# Patient Record
Sex: Male | Born: 1950 | Race: White | Hispanic: No | Smoking: Former smoker
Health system: Southern US, Community
[De-identification: ages and names within clinical notes are randomized; demographics above are authoritative.]

## PROBLEM LIST (undated history)

## (undated) DIAGNOSIS — I10 Essential (primary) hypertension: Secondary | ICD-10-CM

## (undated) DIAGNOSIS — I219 Acute myocardial infarction, unspecified: Secondary | ICD-10-CM

## (undated) DIAGNOSIS — Z872 Personal history of diseases of the skin and subcutaneous tissue: Secondary | ICD-10-CM

## (undated) DIAGNOSIS — E785 Hyperlipidemia, unspecified: Secondary | ICD-10-CM

## (undated) DIAGNOSIS — M1990 Unspecified osteoarthritis, unspecified site: Secondary | ICD-10-CM

## (undated) DIAGNOSIS — I2510 Atherosclerotic heart disease of native coronary artery without angina pectoris: Secondary | ICD-10-CM

## (undated) HISTORY — DX: Personal history of diseases of the skin and subcutaneous tissue: Z87.2

## (undated) HISTORY — DX: Essential (primary) hypertension: I10

## (undated) HISTORY — DX: Unspecified osteoarthritis, unspecified site: M19.90

## (undated) HISTORY — DX: Acute myocardial infarction, unspecified: I21.9

## (undated) HISTORY — DX: Hyperlipidemia, unspecified: E78.5

---

## 1979-04-10 HISTORY — PX: SHOULDER SURGERY: SHX246

## 1998-10-11 ENCOUNTER — Emergency Department (HOSPITAL_COMMUNITY): Admission: EM | Admit: 1998-10-11 | Discharge: 1998-10-11 | Payer: Self-pay | Admitting: *Deleted

## 2008-01-11 ENCOUNTER — Emergency Department (HOSPITAL_COMMUNITY): Admission: EM | Admit: 2008-01-11 | Discharge: 2008-01-11 | Payer: Self-pay | Admitting: Emergency Medicine

## 2008-05-17 ENCOUNTER — Inpatient Hospital Stay (HOSPITAL_COMMUNITY): Admission: EM | Admit: 2008-05-17 | Discharge: 2008-05-23 | Payer: Self-pay | Admitting: Emergency Medicine

## 2008-05-20 ENCOUNTER — Encounter (INDEPENDENT_AMBULATORY_CARE_PROVIDER_SITE_OTHER): Payer: Self-pay | Admitting: Cardiology

## 2008-06-03 ENCOUNTER — Encounter (HOSPITAL_COMMUNITY): Admission: RE | Admit: 2008-06-03 | Discharge: 2008-06-07 | Payer: Self-pay | Admitting: Cardiology

## 2010-07-25 LAB — LIPID PANEL
HDL: 33 mg/dL — ABNORMAL LOW (ref >39)
LDL Cholesterol: 147 mg/dL — ABNORMAL HIGH (ref 0–99)
Total CHOL/HDL Ratio: 7.6 RATIO
Total CHOL/HDL Ratio: 7.7 RATIO
Triglycerides: 288 mg/dL — ABNORMAL HIGH (ref <150)
VLDL: 58 mg/dL — ABNORMAL HIGH (ref 0–40)

## 2010-07-25 LAB — BASIC METABOLIC PANEL
BUN: 13 mg/dL (ref 6–23)
BUN: 17 mg/dL (ref 6–23)
CO2: 25 mEq/L (ref 19–32)
Calcium: 8.1 mg/dL — ABNORMAL LOW (ref 8.4–10.5)
Calcium: 8.4 mg/dL (ref 8.4–10.5)
Chloride: 101 mEq/L (ref 96–112)
Chloride: 96 mEq/L (ref 96–112)
Creatinine, Ser: 0.79 mg/dL (ref 0.4–1.5)
Creatinine, Ser: 0.94 mg/dL (ref 0.4–1.5)
Creatinine, Ser: 0.99 mg/dL (ref 0.4–1.5)
GFR calc Af Amer: >60 mL/min (ref >60)
GFR calc Af Amer: >60 mL/min (ref >60)
GFR calc non Af Amer: >60 mL/min (ref >60)
GFR calc non Af Amer: >60 mL/min (ref >60)
GFR calc non Af Amer: >60 mL/min (ref >60)
Glucose, Bld: 106 mg/dL — ABNORMAL HIGH (ref 70–99)
Glucose, Bld: 111 mg/dL — ABNORMAL HIGH (ref 70–99)
Potassium: 3.8 mEq/L (ref 3.5–5.1)
Sodium: 130 mEq/L — ABNORMAL LOW (ref 135–145)

## 2010-07-25 LAB — CARDIAC PANEL(CRET KIN+CKTOT+MB+TROPI)
CK, MB: 14.8 ng/mL — ABNORMAL HIGH (ref 0.3–4.0)
CK, MB: 27.2 ng/mL — ABNORMAL HIGH (ref 0.3–4.0)
CK, MB: 3.8 ng/mL (ref 0.3–4.0)
CK, MB: 458.1 ng/mL — ABNORMAL HIGH (ref 0.3–4.0)
CK, MB: 566.5 ng/mL — ABNORMAL HIGH (ref 0.3–4.0)
Relative Index: 10.1 — ABNORMAL HIGH (ref 0.0–2.5)
Relative Index: 11.6 — ABNORMAL HIGH (ref 0.0–2.5)
Relative Index: 4.2 — ABNORMAL HIGH (ref 0.0–2.5)
Total CK: 108 U/L (ref 7–232)
Total CK: 486 U/L — ABNORMAL HIGH (ref 7–232)
Troponin I: 17.12 ng/mL (ref 0.00–0.06)
Troponin I: 22.93 ng/mL (ref 0.00–0.06)
Troponin I: 23.63 ng/mL (ref 0.00–0.06)
Troponin I: 28.07 ng/mL (ref 0.00–0.06)
Troponin I: 52.08 ng/mL (ref 0.00–0.06)
Troponin I: 6.35 ng/mL (ref 0.00–0.06)
Troponin I: >100 ng/mL (ref 0.00–0.06)

## 2010-07-25 LAB — CBC
HCT: 38.6 % — ABNORMAL LOW (ref 39.0–52.0)
HCT: 51.6 % (ref 39.0–52.0)
Hemoglobin: 14.8 g/dL (ref 13.0–17.0)
MCHC: 34 g/dL (ref 30.0–36.0)
MCHC: 34.7 g/dL (ref 30.0–36.0)
MCV: 89.4 fL (ref 78.0–100.0)
MCV: 89.5 fL (ref 78.0–100.0)
MCV: 90.5 fL (ref 78.0–100.0)
Platelets: 181 10*3/uL (ref 150–400)
Platelets: 222 10*3/uL (ref 150–400)
Platelets: 241 10*3/uL (ref 150–400)
RBC: 4.27 MIL/uL (ref 4.22–5.81)
RBC: 5.77 MIL/uL (ref 4.22–5.81)
RDW: 14.9 % (ref 11.5–15.5)
RDW: 14.9 % (ref 11.5–15.5)
WBC: 12.2 10*3/uL — ABNORMAL HIGH (ref 4.0–10.5)
WBC: 16.9 10*3/uL — ABNORMAL HIGH (ref 4.0–10.5)
WBC: 21.3 10*3/uL — ABNORMAL HIGH (ref 4.0–10.5)

## 2010-07-25 LAB — POCT CARDIAC MARKERS

## 2010-07-25 LAB — POCT I-STAT, CHEM 8
Chloride: 109 mEq/L (ref 96–112)
Creatinine, Ser: 0.9 mg/dL (ref 0.4–1.5)

## 2010-07-25 LAB — PROTIME-INR
INR: 0.9 (ref 0.00–1.49)
Prothrombin Time: 12.6 seconds (ref 11.6–15.2)

## 2010-07-25 LAB — DIFFERENTIAL
Basophils Absolute: 0.1 10*3/uL (ref 0.0–0.1)
Basophils Relative: 1 % (ref 0–1)
Lymphocytes Relative: 34 % (ref 12–46)
Lymphs Abs: 5.8 10*3/uL — ABNORMAL HIGH (ref 0.7–4.0)
Neutro Abs: 9.1 10*3/uL — ABNORMAL HIGH (ref 1.7–7.7)
Neutrophils Relative %: 54 % (ref 43–77)

## 2010-07-25 LAB — HEPARIN LEVEL (UNFRACTIONATED)
Heparin Unfractionated: 0.35 IU/mL (ref 0.30–0.70)
Heparin Unfractionated: <0.1 IU/mL — ABNORMAL LOW (ref 0.30–0.70)

## 2010-08-22 NOTE — Cardiovascular Report (Signed)
NAMEJORDIE, Tyler Day                ACCOUNT NO.:  [id number]   MEDICAL RECORD NO.:  [id number]          PATIENT TYPE:  INP   LOCATION:  2903                         FACILITY:  [facility]   PHYSICIAN:  Eduardo Osier. Sharyn Lull, M.D. DATE OF BIRTH:  26-Mar-1951   DATE OF PROCEDURE:  05/19/2008  DATE OF DISCHARGE:                            CARDIAC CATHETERIZATION   PROCEDURES:  1. Left cardiac catheterization with selective left coronary      angiography via right groin using Judkins technique.  2. Successful percutaneous transluminal coronary angioplasty to      proximal left circumflex using 2.5 x 8 mm long Voyager balloon.  3. Successful deployment of 3.0 x 15 mm long XIENCE V stent in      proximal left circumflex.  4. Successful postdilatation of the stent using 3.25 x 12 mm long [place]      Voyager balloon.   INDICATIONS FOR PROCEDURE:  Mr. Tyler Day is a 60 year old white male  with past medical history significant for hypertension, on no  medications, tobacco abuse, positive family history of coronary artery  disease, and dental caries, was admitted on May 17, 2008, with acute  anterolateral wall MI, and was noted to have 100% occluded proximal LAD  with TIMI 0 flow, and was noted to have also moderate ostial left main  stenosis in the range of 40-50% and critical stenosis in proximal  circumflex.  The patient subsequently emergently underwent PTCA stenting  to proximal LAD with excellent results postprocedure.  The patient  continued to have recurrent chest pain during the hospital stay,  although his enzymes are trending down due to critical left circumflex  stenosis.  Discussed with the patient and family at length regarding  left cath, possible PTCA stenting to left circumflex, its risks and  benefits i.e., death, MI, stroke, need for emergency CABG, risk of  restenosis, local vascular complications, occlusion of obtuse marginal  small branch due to plaque shift, etc., and consented  for procedure.  The patient was discussed at length about moderate left main stenosis  also which may require CABG or stenting to the ostial left main in  future.  The patient understands the risks and benefits and consented  for the procedure.   PROCEDURE:  After obtaining the informed consent, the patient was  brought to the cath lab and was placed on fluoroscopy table.  Right  groin was prepped and draped in usual fashion.  Xylocaine 2% was used  for local anesthesia in the right groin.  With the help of thin-wall  needle, 6-French arterial sheath was placed.  The sheath was aspirated  and flushed.  Next, 6-French XBLAD guiding catheter was advanced over  the wire under fluoroscopic guidance up to the ascending aorta.  Wire  was pulled out.  The catheter was aspirated and connected to the  manifold.  Catheter was further advanced and engaged into the left  coronary ostium.  Multiple views of the left system were taken.   FINDINGS:  LV was not done.  Left main has 50% ostial and 40% distal  stenosis.  There was  no dampening of pressure with guiding catheter.  LAD was patent at prior PTCA stented site with TIMI 3 distal flow.  Left  circumflex has 90-95% proximal stenosis and 50-60% distal stenosis.   INTERVENTIONAL PROCEDURE:  Successful percutaneous transluminal coronary  angioplasty to proximal left circumflex was done using 2.5 x 8 mm long  Voyager balloon for predilatation and then 3.0 x 15 mm long XIENCE V  stent was deployed at 11 atmospheric pressure in proximal left  circumflex.  Stent was postdilated using 3.25 x 12 mm long [place] Voyager  balloon going up to 18 atmospheric pressure.  Lesion was dilated from 90-  95% to 0% residual with excellent TIMI grade 3 distal flow without  evidence of dissection or distal embolization.  The patient  received weight-based heparin and 300 mg of Plavix during the procedure,  and was continued on Integrilin during the procedure.  The  patient  tolerated the procedure well.  There were no complications.  The patient  was transferred to recovery room in stable condition.      Eduardo Osier. Sharyn Lull, M.D.  Electronically Signed     MNH/MEDQ  D:  05/20/2008  T:  05/20/2008  Job:  [id number]   cc:   Cath Lab

## 2010-08-22 NOTE — Discharge Summary (Signed)
Tyler Day, Tyler Day                ACCOUNT NO.:  [id number]   MEDICAL RECORD NO.:  [id number]          PATIENT TYPE:  INP   LOCATION:  2023                         FACILITY:  [facility]   PHYSICIAN:  Eduardo Osier. Sharyn Lull, M.D. DATE OF BIRTH:  [birth year]   DATE OF ADMISSION:  05/17/2008  DATE OF DISCHARGE:  05/23/2008                               DISCHARGE SUMMARY   ADMITTING DIAGNOSES:  1. Acute anterolateral wall myocardial infarction.  2. Hypertensive urgency.  3. Tobacco abuse.  4. Positive family history of coronary artery disease.   DISCHARGE DIAGNOSES:  1. Acute anteroseptal/lateral wall myocardial infarction.  2. Status post hypertensive urgency.  3. Status post, post-infarction angina.  4. Status post emergency percutaneous coronary intervention to 100%      occluded.  5. Status post percutaneous coronary intervention to proximal left      circumflex.  6. Hypertension.  7. Glucose intolerance.  8. Hypercholesteremia.  9. Tobacco abuse.  10.Positive family history of coronary artery disease.  11.Dental caries.   DISCHARGE HOME MEDICATIONS:  1. Enteric-coated aspirin 325 mg 1 tablet daily.  2. Plavix 75 mg 1 tablet daily with food.  3. Toprol-XL 100 mg 1 tablet daily.  4. Imdur 60 mg 1 tablet daily in the morning.  5. Altace 10 mg 1 capsule daily.  6. Crestor 20 mg 1 tablet daily.  7. Niaspan 500 mg 1 tablet daily at night.  8. Nitrostat 0.4 sublingual used as directed.  9. Pen VK 500 mg 1 tablet every 6 hours for 7 days.   DIET:  Low-salt, low-cholesterol.  The patient has been advised to avoid  sweets.   ACTIVITY:  Increase activities slowly.   The patient has been scheduled for phase 2 cardiac rehab.  Post PTCA  stent instructions have been given.  Follow up with me in 1 week.   CONDITION AT DISCHARGE:  Stable.   The patient will follow up with his dentist in near future for possible  extraction of molar teeth.   BRIEF HISTORY AND HOSPITAL COURSE:  Mr. Tyler Day  is a 60 year old white  male with past medical history significant for hypertension, on no  medications, tobacco abuse, positive family history of coronary artery  disease, and dental caries.  He came to the ER complaining of  retrosternal chest pain described as pressure grade 10/10 associated  with feeling hot and diaphoretic while at work around 8 a.m.  EKG done  in the ER showed normal sinus rhythm with ST elevation in V1-V5.  The  patient received aspirin, Plavix, morphine, IV nitrates in the ED and IV  Lopressor in the cath lab and decrease of chest pain to 8/10.  The  patient gives history of angina associated with shortness of breath off  and on for last 1 year, but did not seek any medical attention.  Denies  any palpitation, lightheadedness, or syncope.  Denies PND, orthopnea, or  leg swelling.  Denies history of rest or nocturnal angina in the past.   PAST MEDICAL HISTORY:  As above.   PAST SURGICAL HISTORY:  He had right shoulder surgery  in the past.   ALLERGIES:  No known drug allergies.   MEDICATIONS:  None.   SOCIAL HISTORY:  He is divorced.  No children.  Smokes 1 pack per day  for 24 plus years.  No history of alcohol abuse.  He works in Air[employer].   FAMILY HISTORY:  Father died of MI in his 62s.  Mother died of cancer.  One sister in good health.  One brother died of drug overdose.   PHYSICAL EXAMINATION:  GENERAL:  He was alert, awake, and oriented x3.  VITAL SIGNS:  Blood pressure was 190/110, pulse was 84 and regular.  HEENT:  Conjunctivae was pink.  NECK:  Supple.  No JVD.  LUNGS:  Clear to auscultation anterolaterally.  CARDIOVASCULAR:  S1 and S2 was normal.  There was soft systolic murmur  and S4 gallop.  ABDOMEN:  Soft.  Bowel sounds were present.  Obese, nontender.  EXTREMITIES:  There was no clubbing, cyanosis, or edema.   LABORATORY DATA:  EKG showed normal sinus rhythm with ST elevation in V1-  V5 as above.  His other labs, cardiac panel CK  postprocedure was 5620,  MB of 566.5, relative index 10.1, troponin I was 52.08, repeat CK was  3958, MB 458, relative index 11.6, troponin I was more than 100.  On  May 19, 2008, CK was 486, MB 27.2, relative index 5.6, troponin I  28.07.  On May 19, 2008, repeat CK was 352, MB 15.8, relative index  4.2, troponin I 22.93, yesterday CK total was 108, MB 5.1, relative  index 4.7, troponin I 9.66.  Today CK is 79, MB is 3.8, troponin I was  trending down to 6.35.  His admission CBC was 14.8, hematocrit 43.5, and  white count of 21.3.  Repeat CBC on May 20, 2008, was 13.4,  hematocrit 38.6, and white count of 13.9.  CBC yesterday was 13.2,  hematocrit 38.3, white count of 12.2.  Sodium was 130, potassium 3.8,  chloride 96, bicarb 24, glucose 111, BUN 13, and creatinine 1.03.  Repeat fasting glucose was 106.  Lipid panel, cholesterol total was 236,  triglycerides 288, LDL 147, HDL was 31.   BRIEF HOSPITAL COURSE:  The patient was directly transferred from ED to  cath lab and underwent emergency left cath and PTCA stenting to 100%  occluded LAD as per procedure report.  The patient had recurrent  episodes of chest pain, during the hospital stay and subsequently  underwent PCI to proximal left circumflex on May 19, 2008, and as  per procedure report, the patient also had episode of nonsustained VT  and bigeminy.  He was stated on IV amiodarone for 48 hours, which has  been discontinued.  The patient did not have any further episodes of  chest pain during the hospital stay.  His is groin is stable with no  evidence of hematoma or bruits.  There is mild ecchymosis.  Phase 1  cardiac rehab was called.  The patient has been ambulating in hallway  without any problems.  The patient will be discharged home on above  medications and will be followed up in my office in 1 week.  The patient  is scheduled for phase 2 cardiac rehab as outpatient.      Eduardo Osier. Sharyn Lull, M.D.   Electronically Signed     MNH/MEDQ  D:  05/23/2008  T:  05/24/2008  Job:  [id number]

## 2010-08-22 NOTE — Cardiovascular Report (Signed)
NAMEBREON, Tyler Day                ACCOUNT NO.:  [id number]   MEDICAL RECORD NO.:  [id number]          PATIENT TYPE:  INP   LOCATION:  2903                         FACILITY:  [facility]   PHYSICIAN:  Eduardo Osier. Sharyn Lull, M.D. DATE OF BIRTH:  04-Dec-1950   DATE OF PROCEDURE:  05/17/2008  DATE OF DISCHARGE:                            CARDIAC CATHETERIZATION   PROCEDURE:  1. Left cardiac catheterization with selective left and right coronary      angiography, LV graphy via right groin using Judkins technique.  2. Successful percutaneous transluminal coronary angioplasty to      proximal LAD using 2.5 x 12 mm long Voyager balloon.  3. Successful deployment of 2.75 x 23 mm long Xience V stent in      proximal LAD.  4. Successful post dilatation of this stent using 3.0 x 15 mm long [place]      Voyager balloon.   INDICATIONS FOR THE PROCEDURE:  Mr. Tyler Day is 60 year old white male  with past medical history significant for hypertension, on no  medications; tobacco abuse; positive family history of coronary artery  disease; dental caries.  He came to the ER complaining of retrosternal  chest pain described as pressure, grade 10/10, associated with feeling  hot and diaphoretic while at work around 8:00 a.m.  EKG done in the ER  showed normal sinus rhythm with ST elevation in V1-V5.  Code STEMI was  called.  The patient received aspirin, Plavix, morphine sulfate, IV  nitrates in the ED and IV Lopressor in the cath lab with decrease of  chest pain from 10/10 to 8/10.  The patient gives history of angina  associated with shortness of breath off and on for last 1 year, but did  not seek any medical attention.  Denies any palpitation,  lightheadedness, or syncope.  Denies history of PND, orthopnea, or leg  swelling.  Denies history of rest or nocturnal angina in the past.   PAST MEDICAL HISTORY:  As above.   PAST SURGICAL HISTORY:  He had right shoulder surgery in the past.   ALLERGIES:  No known drug  allergies.   SOCIAL HISTORY:  He is divorced.  No children.  Smokes one pack per day  for 20 plus years.  No history of alcohol abuse.  He works in Airline pilot.   MEDICATIONS:  None.   FAMILY HISTORY:  Father died of MI in his 44s.  Mother died of cancer.  One sister in good health.  One brother died of drug overdose.   PHYSICAL EXAMINATION:  GENERAL:  He was alert, awake, and oriented x3.  VITAL SIGNS:  Blood pressure was 190/110, pulse was 84 and regular.  HEENT:  Conjunctivae was pink.  NECK:  Supple.  No JVD.  LUNGS:  Clear to auscultation anterolaterally.  CARDIOVASCULAR:  S1 and S2 was normal.  There was soft systolic murmur  and S4 gallop.  ABDOMEN:  Soft.  Bowel sounds present.  Obese, nontender.  EXTREMITIES:  There is no clubbing, cyanosis, or edema.   IMPRESSION:  Acute anterolateral wall myocardial infarction,  hypertensive urgency, tobacco abuse, positive family  history of coronary  artery disease.  Discussed with the patient briefly regarding emergency  left catheterization, possible percutaneous transluminal coronary  angioplasty stenting and its risks and benefits i.e. death, myocardial  infarction, stroke, need for emergency coronary artery bypass graft,  risk of restenosis, local vascular complications, etc., and consented  for the procedure.   PROCEDURE:  After obtaining the informed consent, the patient was  brought to the cath lab and was placed on fluoroscopy table.  Right  groin was prepped and draped in usual fashion.  Xylocaine 2% was used  for local anesthesia in the right groin.  With the help of thin-wall  needle, 6-French arterial sheath was placed.  The sheath was aspirated  and flushed.  Next, 6-French left Judkins catheter was advanced over the  wire under fluoroscopic guidance into the ascending aorta.  Wire was  pulled out, the catheter was aspirated, and connected to the manifold.  Catheter was further advanced and engaged into left coronary ostium.   Multiple views of the left system were taken.  Next, the catheter was  disengaged and was pulled out over the wire and was replaced with 6-  [place] right Judkins catheter, which was advanced over the wire under  fluoroscopic guidance up to the ascending aorta.  Wire was pulled out.  The catheter was aspirated and connected to the manifold.  Catheter was  further advanced and engaged into right coronary ostium.  A single view  of right coronary artery was obtained.  Next, the catheter was  disengaged and was pulled out over the wire and was replaced with 6-  French pigtail catheter at the end of the procedure, which was advanced  over the wire under fluoroscopic guidance up to the ascending aorta.  Wire was pulled out.  The catheter was aspirated and connected to the  manifold.  Catheter was further advanced across the aortic valve into  the LV.  LV pressures were recorded.  Next, LV graft was done in 30-  degree RAO position.  Post angiographic pressures were recorded from LV  and then pullback pressures were recorded from the aorta.  There was no  gradient across the aortic valve.  Next, pigtail catheter was pulled out  over the wire.  [name] were aspirated and flushed.   FINDINGS:  LV showed anterolateral wall severe hypokinesia, EF of 45%.  Left main has 40%-50% ostial stenosis and 30%-40% distal stenosis.  LAD  was 100% occluded proximally after giving off diagonal I.  Diagonal I  has 20%-25% proximal and 30%-40% mid stenosis.  Left circumflex has 90%  proximal stenosis and 60%-70% mid and distal junction stenosis.  OM-1  has 10%-20% proximal stenosis.  OM-2  to OM-5, a very small.  RCA has  30%-40% proximal and mid stenosis.  PDA is small, which is patent.  PLV  branches are very small.   INTERVENTIONAL PROCEDURE:  Successful percutaneous transluminal coronary  angioplasty to proximal LAD was done using 2.5 x 12 mm long Voyager  balloon for predilatation and then 2.75 x 23 mm long  Xience V stent was  deployed at 13 atmospheric pressure.  Stent was in proximal LAD.  Stent  was postdilated using 3.0 x 15 mm long  Voyager balloon, going up to  18 atmospheric pressure.  Lesion dilated from 100% to 0% residual.  The  patient received weight-based Angiomax and 600 mg of Plavix during the  procedure.  The patient tolerated procedure well.  Post procedure, there  was  TIMI grade 3 distal flow without evidence of dissection or distal  embolization.  The patient tolerated procedure well.  There were no  complications.  The patient was transferred to recovery room in stable  condition.      Eduardo Osier. Sharyn Lull, M.D.  Electronically Signed     MNH/MEDQ  D:  05/17/2008  T:  05/18/2008  Job:  [id number]   cc:   Cath Lab

## 2011-01-09 LAB — POCT I-STAT, CHEM 8
BUN: 20
HCT: 48
Hemoglobin: 16.3
Sodium: 137
TCO2: 26

## 2011-03-24 ENCOUNTER — Other Ambulatory Visit: Payer: Self-pay | Admitting: Cardiology

## 2011-04-30 ENCOUNTER — Ambulatory Visit (INDEPENDENT_AMBULATORY_CARE_PROVIDER_SITE_OTHER): Payer: 59

## 2011-04-30 DIAGNOSIS — J019 Acute sinusitis, unspecified: Secondary | ICD-10-CM

## 2011-05-10 ENCOUNTER — Other Ambulatory Visit: Payer: Self-pay | Admitting: Cardiology

## 2012-01-08 ENCOUNTER — Ambulatory Visit (INDEPENDENT_AMBULATORY_CARE_PROVIDER_SITE_OTHER): Payer: 59 | Admitting: Family Medicine

## 2012-01-08 ENCOUNTER — Ambulatory Visit: Payer: 59

## 2012-01-08 VITALS — BP 135/70 | HR 56 | Temp 98.2°F | Resp 16 | Ht 68.5 in | Wt 225.0 lb

## 2012-01-08 DIAGNOSIS — R05 Cough: Secondary | ICD-10-CM

## 2012-01-08 DIAGNOSIS — R062 Wheezing: Secondary | ICD-10-CM

## 2012-01-08 DIAGNOSIS — I2510 Atherosclerotic heart disease of native coronary artery without angina pectoris: Secondary | ICD-10-CM | POA: Insufficient documentation

## 2012-01-08 DIAGNOSIS — E669 Obesity, unspecified: Secondary | ICD-10-CM | POA: Insufficient documentation

## 2012-01-08 DIAGNOSIS — I10 Essential (primary) hypertension: Secondary | ICD-10-CM | POA: Insufficient documentation

## 2012-01-08 DIAGNOSIS — J209 Acute bronchitis, unspecified: Secondary | ICD-10-CM

## 2012-01-08 DIAGNOSIS — E785 Hyperlipidemia, unspecified: Secondary | ICD-10-CM | POA: Insufficient documentation

## 2012-01-08 MED ORDER — AZITHROMYCIN 250 MG PO TABS: ORAL_TABLET | ORAL | Status: DC

## 2012-01-08 MED ORDER — GUAIFENESIN-CODEINE 100-10 MG/5ML PO SYRP: 5.0000 mL | ORAL_SOLUTION | Freq: Three times a day (TID) | ORAL | Status: DC | PRN

## 2012-01-08 NOTE — Patient Instructions (Addendum)

## 2012-01-08 NOTE — Progress Notes (Signed)
  Subjective:    Patient ID: Tyler Day, male    DOB: [birth year], 61 y.o.   MRN: [id number]  HPI  [name] 5d prev w/ pharyngitis, sneezing and congestion, low grade fever, fatigue.  [name] could only go to work 1/2d due to fatigue and now he is developing a scratchy cough productive of yellow congestion.  Non-smoker, no shob, no cp, no ha or sinus cong, norm bm/uop, tol po.  Tried sudafed pe x 1 w/ some relief last night.  Active Ambulatory Problems    Diagnosis Date Noted  . Coronary artery disease 01/08/2012  . Obesity 01/08/2012  . Hypertension 01/08/2012  . Hyperlipidemia LDL goal < 100 01/08/2012   Resolved Ambulatory Problems    Diagnosis Date Noted  . No Resolved Ambulatory Problems   Past Medical History  Diagnosis Date  . Myocardial infarction   . Hyperlipidemia       Review of Systems  Constitutional: Positive for fever, chills, activity change and fatigue. Negative for diaphoresis and appetite change.  HENT: Positive for congestion, sore throat, rhinorrhea, sneezing and postnasal drip. Negative for ear pain and sinus pressure.   Eyes: Negative for discharge.  Respiratory: Positive for cough. Negative for chest tightness, shortness of breath and wheezing.   Cardiovascular: Negative for chest pain and leg swelling.  Gastrointestinal: Negative for nausea, vomiting, abdominal pain, diarrhea and constipation.  Genitourinary: Negative for dysuria and difficulty urinating.  Neurological: Negative for dizziness, weakness and headaches.  Psychiatric/Behavioral: Positive for disturbed wake/sleep cycle.       Objective:   Physical Exam  Vitals reviewed. Constitutional: He is oriented to person, place, and time. He appears well-developed and well-nourished. No distress.  HENT:  Head: Normocephalic and atraumatic.  Right Ear: Tympanic membrane, external ear and ear canal normal.  Left Ear: Tympanic membrane, external ear and ear canal normal.  Nose: Nose normal.    Mouth/Throat: Uvula is midline, oropharynx is clear and moist and mucous membranes are normal. No oropharyngeal exudate.  Eyes: Conjunctivae normal are normal. Right eye exhibits no discharge. Left eye exhibits no discharge. No scleral icterus.  Neck: Normal range of motion. Neck supple. No thyromegaly present.  Cardiovascular: Normal rate, regular rhythm, normal heart sounds and intact distal pulses.   Pulmonary/Chest: Effort normal. No accessory muscle usage. No respiratory distress. He has no decreased breath sounds. He has wheezes in the left upper field.  Musculoskeletal: He exhibits no edema.  Lymphadenopathy:    He has no cervical adenopathy.  Neurological: He is alert and oriented to person, place, and time.  Skin: Skin is warm and dry. He is not diaphoretic.  Psychiatric: He has a normal mood and affect. His behavior is normal.         CXR: No sig abnormality seen Assessment & Plan:  1. Bronchitis - Will cover w/ zpack and cough syrup. Stop otc meds due to HTN.

## 2012-01-11 NOTE — Progress Notes (Signed)
Reviewed and agree.

## 2013-02-22 IMAGING — CR DG CHEST 2V
2 series · 2 of 2 positions shown · non-contrast
Comparison: 05/17/2008

CLINICAL DATA: Cough, cold.  Low grade fever.

CHEST - 2 VIEW

[PA]
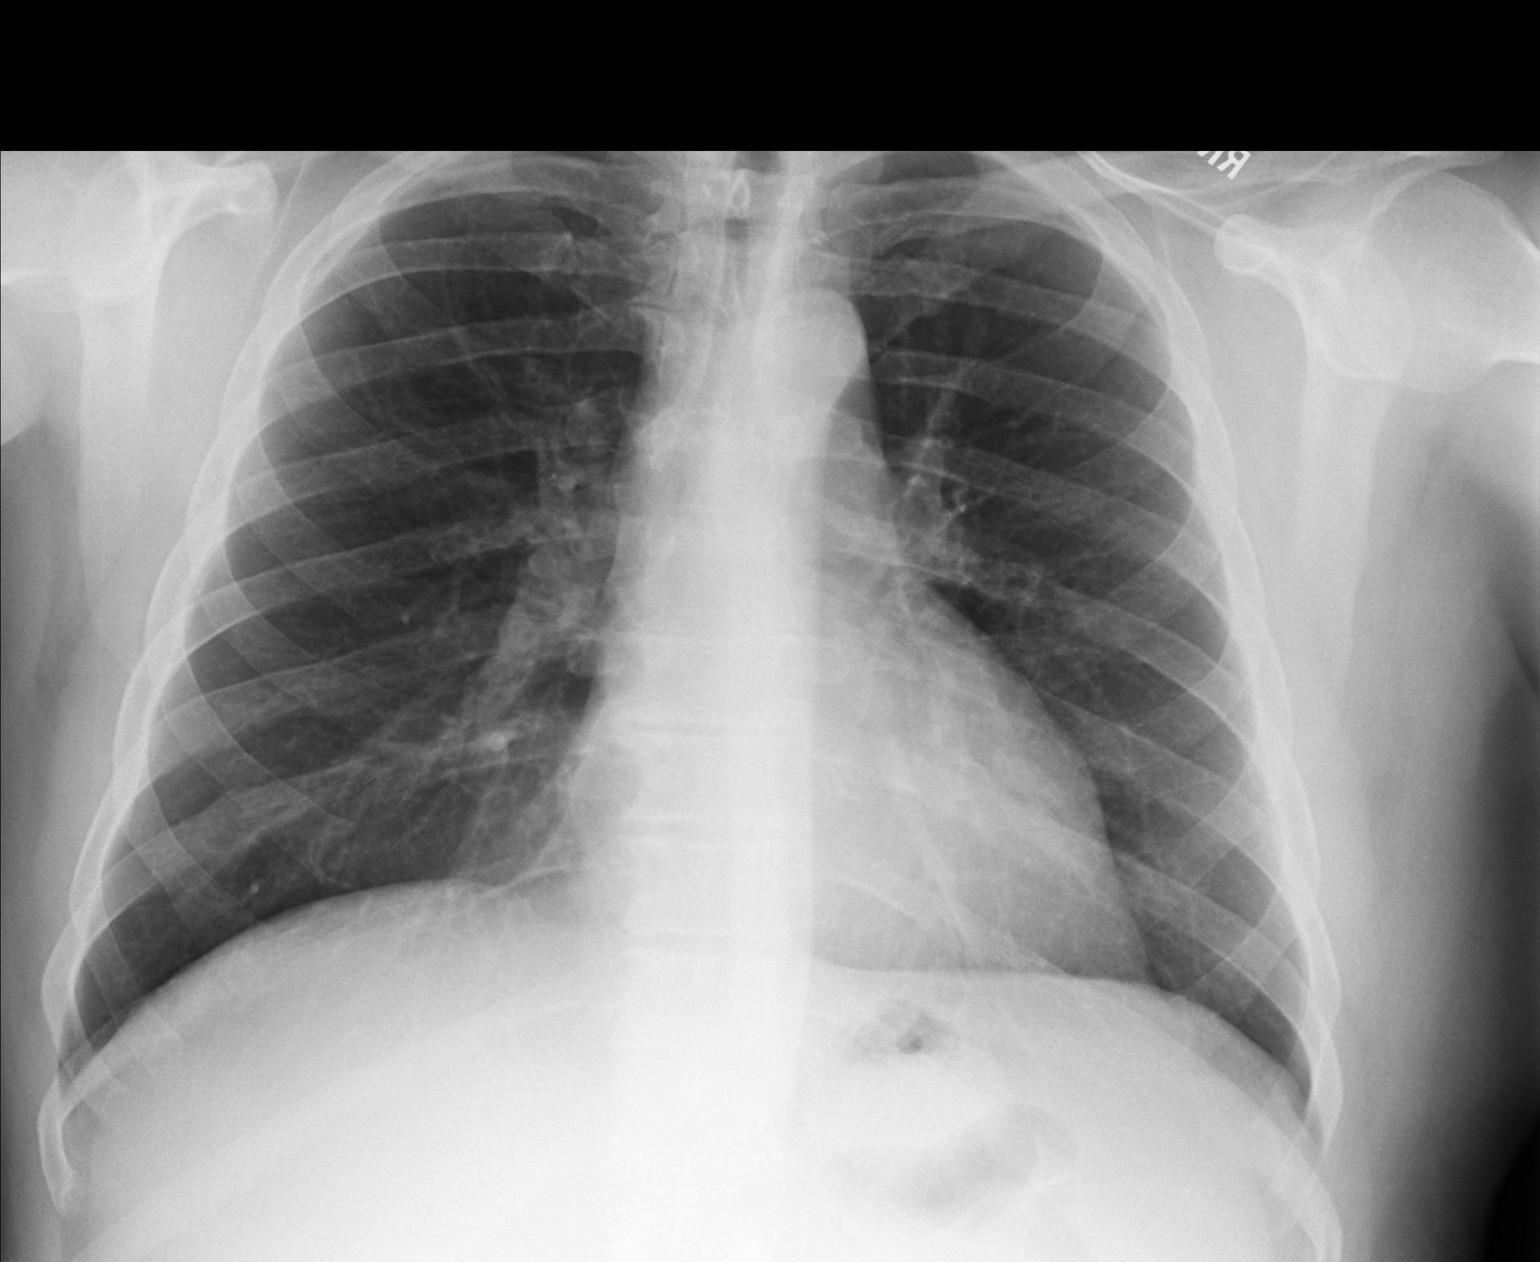

[lateral]
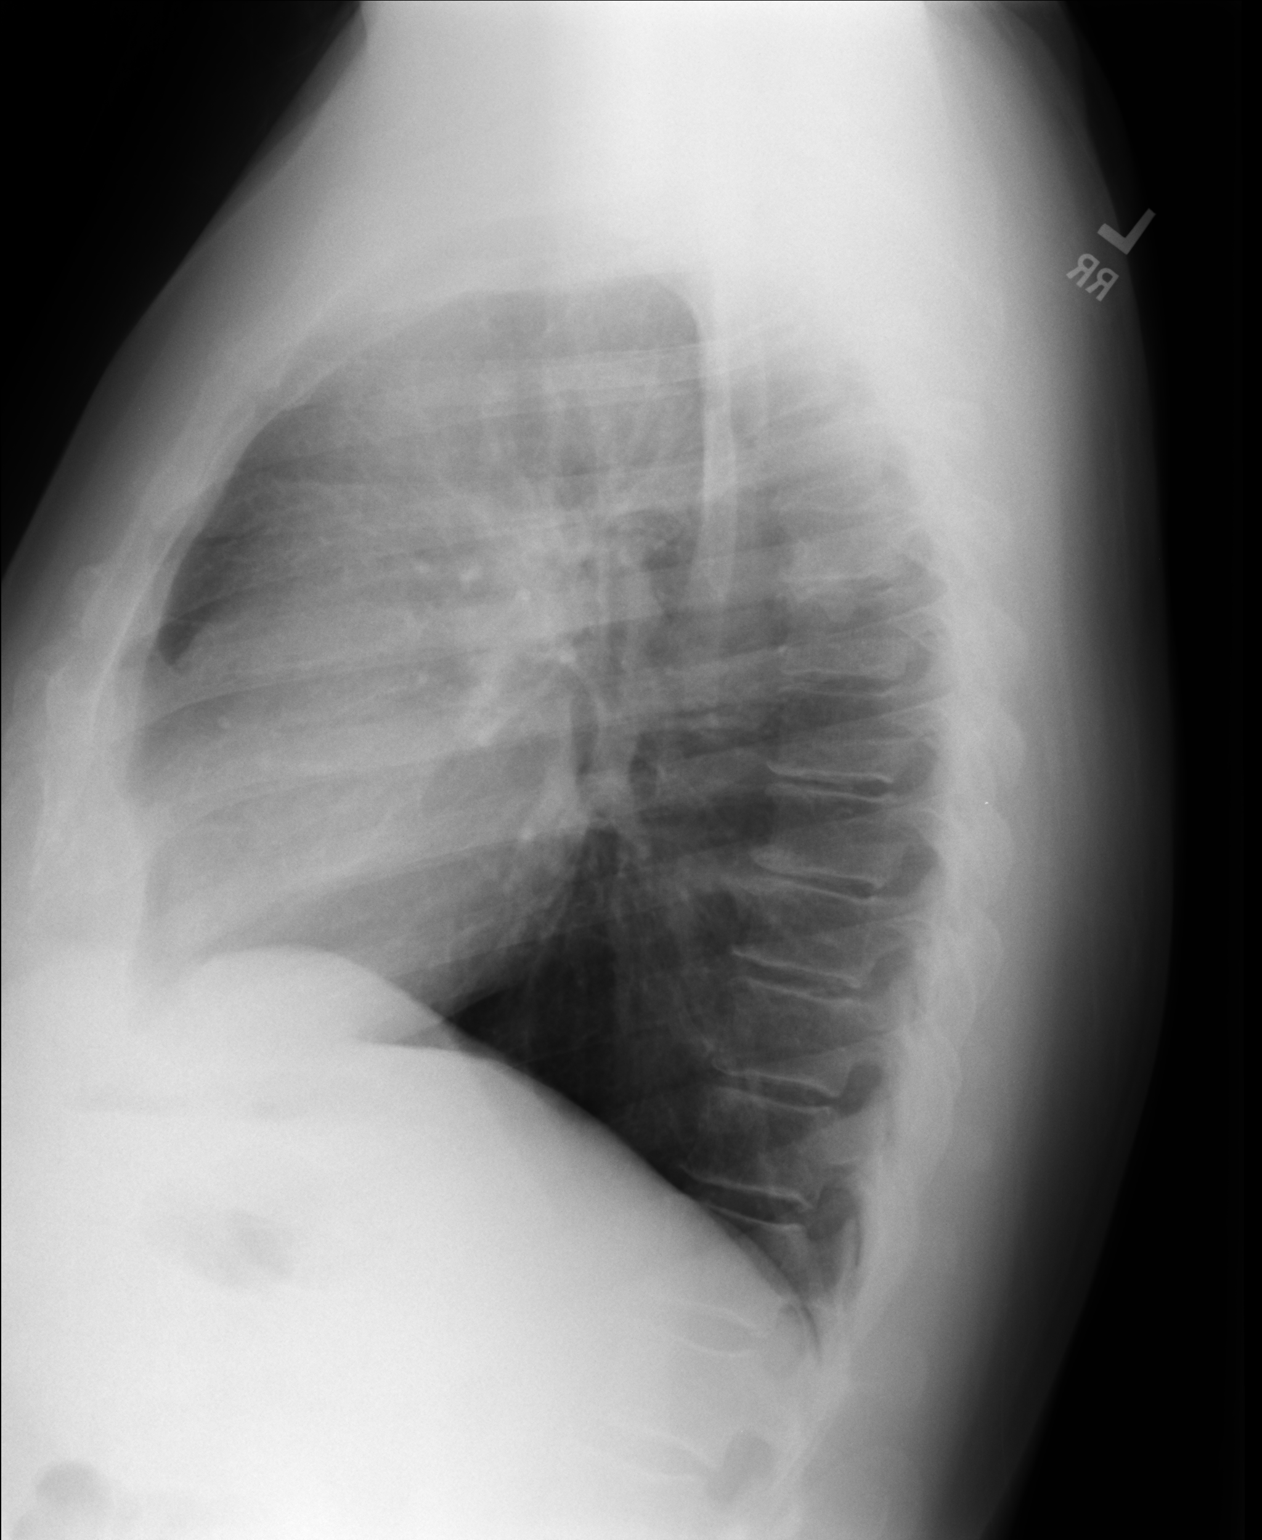

[2 of 2 positions shown; findings below may reference images not displayed]

FINDINGS: Heart and mediastinal contours are within normal limits.
No focal opacities or effusions.  No acute bony abnormality.  Old
healed right ninth rib fracture.
IMPRESSION: No active cardiopulmonary disease.

## 2013-02-23 ENCOUNTER — Ambulatory Visit (INDEPENDENT_AMBULATORY_CARE_PROVIDER_SITE_OTHER): Payer: 59 | Admitting: Family Medicine

## 2013-02-23 VITALS — BP 110/70 | HR 67 | Temp 99.3°F | Resp 16 | Ht 67.5 in | Wt 240.0 lb

## 2013-02-23 DIAGNOSIS — L723 Sebaceous cyst: Secondary | ICD-10-CM

## 2013-02-23 DIAGNOSIS — L02219 Cutaneous abscess of trunk, unspecified: Secondary | ICD-10-CM

## 2013-02-23 MED ORDER — DOXYCYCLINE HYCLATE 100 MG PO TABS: 100.0000 mg | ORAL_TABLET | Freq: Two times a day (BID) | ORAL | Status: DC

## 2013-02-23 NOTE — Progress Notes (Signed)
   Patient ID: Tyler Day MRN: [id number], DOB: July 25, 1950, 62 y.o. Date of Encounter: 02/23/2013, 10:38 AM    PROCEDURE NOTE: Verbal consent obtained. Risks and benefits of the procedure were explained to the patient. Patient made an informed decision to proceed with the procedure. Betadine prep per usual protocol. Local anesthesia obtained with 1%lidocaine with epi 2 cc.   2 cm incision made with 11 blade along lesion.  No culture taken. No purulence expressed. Copious sebaceous material expressed. Sack wall debrided.  Lesion explored revealing no loculations. Irrigated with normal saline.  Packed with 1/4 inch plain packing.  Dressed. Wound care instructions including precautions with patient. Patient tolerated the procedure well. Recheck in 48 hours.      Signed, Eula Listen, PA-C [facility] [place], [place] [address] [phone] 02/23/2013 10:38 AM

## 2013-02-23 NOTE — Progress Notes (Signed)
Patient ID: Tyler Day, male   DOB: 1950/08/12, 62 y.o.   MRN: [id number]   @UMFCLOGO @  Patient ID: Tyler Day MRN: [id number], DOB: [birth year], 62 y.o. Date of Encounter: 02/23/2013, 9:44 AM This chart was scribed for Elvina Sidle, MD by Valera Castle, ED Scribe. This patient was seen in room 5 and the patient's care was started at 9:44 AM.  Primary Physician: No primary provider on file.  Chief Complaint: Cyst  HPI: 62 y.o. year old male with history below presents with a cyst to the back of his neck. He reports being seen by Dr. Milus Glazier for the same cyst, but never following up with his next appointment. He reports the cyst had not been bothering him, but reports sudden discharge from his cyst, onset 5 days ago. He reports trying to squeeze out the pus out over the course of the week, with no improvement. He reports associated subjective fever. He denies any other associated symptoms. He reports a h/o MI, and being followed by a cardiologist.  He reports working inside [employer] at [employer] for his profession.   Past Medical History  Diagnosis Date  . Myocardial infarction   . Hypertension   . Hyperlipidemia      Home Meds: Prior to Admission medications   Medication Sig Start Date End Date Taking? Authorizing Provider  aspirin 81 MG tablet Take 81 mg by mouth daily.   Yes Historical Provider, MD  metoprolol (LOPRESSOR) 100 MG tablet Take 100 mg by mouth 2 (two) times daily.   Yes Historical Provider, MD  ramipril (ALTACE) 10 MG tablet Take 10 mg by mouth daily.   Yes Historical Provider, MD  simvastatin (ZOCOR) 40 MG tablet Take 40 mg by mouth every evening.    Historical Provider, MD    Allergies: No Known Allergies  History   Social History  . Marital Status: Divorced    Spouse Name: N/A    Number of Children: N/A  . Years of Education: N/A   Occupational History  . Not on file.   Social History Main Topics  . Smoking status: Former Smoker -- 0.50 packs/day  for 30 years    Types: Cigarettes    Quit date: 01/08/2008  . Smokeless tobacco: Not on file  . Alcohol Use: Not on file  . Drug Use: Not on file  . Sexual Activity: Not on file   Other Topics Concern  . Not on file   Social History Narrative  . No narrative on file     Review of Systems: Constitutional: negative for chills, night sweats, weight changes, or fatigue  Positive for fever, subjective HEENT: negative for vision changes, hearing loss, congestion, rhinorrhea, ST, epistaxis, or sinus pressure Cardiovascular: negative for chest pain or palpitations Respiratory: negative for hemoptysis, wheezing, shortness of breath, or cough Abdominal: negative for abdominal pain, nausea, vomiting, diarrhea, or constipation Dermatological: negative for rash Positive for cyst, back of neck Neurologic: negative for headache, dizziness, or syncope All other systems reviewed and are otherwise negative with the exception to those above and in the HPI.   Physical Exam: Blood pressure 110/70, pulse 67, temperature 99.3 F (37.4 C), temperature source Oral, resp. rate 16, height 5' 7.5" (1.715 m), weight 240 lb (108.863 kg), SpO2 97.00%., Body mass index is 37.01 kg/(m^2). General: Well developed, well nourished, in no acute distress. Head: Normocephalic, atraumatic, eyes without discharge, sclera non-icteric, nares are without discharge. Bilateral auditory canals clear, TM's are without perforation, pearly grey and translucent with  reflective cone of light bilaterally. Oral cavity moist, posterior pharynx without exudate, erythema, peritonsillar abscess, or post nasal drip.  Neck: Supple. No thyromegaly. Full ROM. No lymphadenopathy. Lungs: Clear bilaterally to auscultation without wheezes, rales, or rhonchi. Breathing is unlabored. Heart: RRR with S1 S2. No murmurs, rubs, or gallops appreciated. Abdomen: Soft, non-tender, non-distended with normoactive bowel sounds. No hepatomegaly. No  rebound/guarding. No obvious abdominal masses. Msk:  Strength and tone normal for age. Extremities/Skin: Warm and dry. No clubbing or cyanosis. No edema. No rashes or suspicious lesions. 5 cm raised left shoulder sebaceous cyst with purulent head, tender. Neuro: Alert and oriented X 3. Moves all extremities spontaneously. Gait is normal. CNII-XII grossly in tact. Psych:  Responds to questions appropriately with a normal affect.     ASSESSMENT AND PLAN:  62 y.o. year old male with    Signed, Elvina Sidle, MD 02/23/2013 9:44 AM     I personally performed the services described in this documentation, which was scribed in my presence. The recorded information has been reviewed and is accurate.

## 2013-02-25 ENCOUNTER — Ambulatory Visit (INDEPENDENT_AMBULATORY_CARE_PROVIDER_SITE_OTHER): Payer: 59 | Admitting: Family Medicine

## 2013-02-25 VITALS — BP 90/60 | HR 60 | Temp 99.0°F | Resp 16 | Ht 67.0 in | Wt 240.0 lb

## 2013-02-25 DIAGNOSIS — L03319 Cellulitis of trunk, unspecified: Secondary | ICD-10-CM

## 2013-02-25 DIAGNOSIS — L02219 Cutaneous abscess of trunk, unspecified: Secondary | ICD-10-CM

## 2013-02-25 NOTE — Progress Notes (Signed)
Patient ID: Tyler Day MRN: [id number], DOB: Aug 21, 1950 62 y.o. Date of Encounter: 02/25/2013, 8:22 AM  Chief Complaint: Wound care   See previous note  HPI: 62 y.o. y/o male presents for wound care s/p I&D on 02/23/13. Doing well No issues or complaints Afebrile/ no chills No nausea or vomiting Tolerating doxycycline.  Pain resolved. Daily dressing change Previous note reviewed  Past Medical History  Diagnosis Date  . Myocardial infarction   . Hypertension   . Hyperlipidemia      Home Meds: Prior to Admission medications   Medication Sig Start Date End Date Taking? Authorizing Provider  aspirin 81 MG tablet Take 81 mg by mouth daily.   Yes Historical Provider, MD  doxycycline (VIBRA-TABS) 100 MG tablet Take 1 tablet (100 mg total) by mouth 2 (two) times daily. 02/23/13  Yes Elvina Sidle, MD  metoprolol (LOPRESSOR) 100 MG tablet Take 100 mg by mouth 2 (two) times daily.   Yes Historical Provider, MD  ramipril (ALTACE) 10 MG tablet Take 10 mg by mouth daily.   Yes Historical Provider, MD  simvastatin (ZOCOR) 40 MG tablet Take 40 mg by mouth every evening.    Historical Provider, MD    Allergies: No Known Allergies  ROS: Constitutional: Afebrile, no chills Cardiovascular: negative for chest pain or palpitations Dermatological: Positive for wound. Negative for erythema, pain, or warmth.  GI: No nausea or vomiting   EXAM: Physical Exam: Blood pressure 90/60, pulse 60, temperature 99 F (37.2 C), temperature source Oral, resp. rate 16, height 5\' 7"  (1.702 m), weight 240 lb (108.863 kg), SpO2 97.00%., Body mass index is 37.58 kg/(m^2). General: Well developed, well nourished, in no acute distress. Nontoxic appearing. Head: Normocephalic, atraumatic, sclera non-icteric.  Neck: Supple. Lungs: Breathing is unlabored. Heart: Normal rate. Skin:  Warm and moist. Dressing and packing in place. No induration, erythema, or tenderness to palpation. Neuro: Alert and  oriented X 3. Moves all extremities spontaneously. Normal gait.  Psych:  Responds to questions appropriately with a normal affect.       PROCEDURE: Dressing and packing removed. Copious amount of purulence on dressing No purulence expressed Wound bed healthy Repacked with 1/4 plain packing Dressing applied  LAB: Culture: not collected  A/P: 62 y.o. y/o male with back cellulitis/abscess as above s/p I&D on 02/23/13.  Wound care per above Continue doxycycline.  Pain well controlled Daily dressing changes Recheck 48 hours  Signed, Rhoderick Moody, PA-C 02/25/2013 8:22 AM

## 2013-02-25 NOTE — Patient Instructions (Signed)
Return Friday for recheck.

## 2013-02-25 NOTE — Progress Notes (Signed)
Subjective: Patient is here for a wound recheck. He had an abscess drained on his upper back by Eula Listen PA.  Objective: Not very inflamed around the outside. The packing was removed. There is a lot of purulence on the packing, but cannot express much more.  Assessment: Status post abscess on left upper back  Plan: Have PA repack lesion

## 2013-02-27 ENCOUNTER — Ambulatory Visit (INDEPENDENT_AMBULATORY_CARE_PROVIDER_SITE_OTHER): Payer: 59 | Admitting: Physician Assistant

## 2013-02-27 VITALS — BP 130/80 | HR 60 | Temp 98.0°F | Resp 16 | Ht 68.0 in | Wt 241.0 lb

## 2013-02-27 DIAGNOSIS — L02219 Cutaneous abscess of trunk, unspecified: Secondary | ICD-10-CM

## 2013-02-27 DIAGNOSIS — L03319 Cellulitis of trunk, unspecified: Secondary | ICD-10-CM

## 2013-02-27 NOTE — Progress Notes (Signed)
Patient ID: ARLO BUTT MRN: [id number], DOB: 12-06-1950 62 y.o. Date of Encounter: 02/27/2013, 8:19 AM  Chief Complaint: Wound care   See previous note  HPI: 62 y.o. y/o male presents for wound care s/p I&D on 02/23/13.  Doing well No issues or complaints Afebrile/ no chills No nausea or vomiting Tolerating doxycycline.  Pain resolved.  Daily dressing change Previous note reviewed  Past Medical History  Diagnosis Date  . Myocardial infarction   . Hypertension   . Hyperlipidemia      Home Meds: Prior to Admission medications   Medication Sig Start Date End Date Taking? Authorizing Provider  aspirin 81 MG tablet Take 81 mg by mouth daily.   Yes Historical Provider, MD  doxycycline (VIBRA-TABS) 100 MG tablet Take 1 tablet (100 mg total) by mouth 2 (two) times daily. 02/23/13  Yes Elvina Sidle, MD  metoprolol (LOPRESSOR) 100 MG tablet Take 100 mg by mouth 2 (two) times daily.   Yes Historical Provider, MD  ramipril (ALTACE) 10 MG tablet Take 10 mg by mouth daily.   Yes Historical Provider, MD  simvastatin (ZOCOR) 40 MG tablet Take 40 mg by mouth every evening.   Yes Historical Provider, MD    Allergies: No Known Allergies  ROS: Constitutional: Afebrile, no chills Cardiovascular: negative for chest pain or palpitations Dermatological: Positive for wound. Negative for erythema, pain, or warmth.  GI: No nausea or vomiting   EXAM: Physical Exam: Blood pressure 130/80, pulse 60, temperature 98 F (36.7 C), temperature source Oral, resp. rate 16, height 5\' 8"  (1.727 m), weight 241 lb (109.317 kg), SpO2 98.00%., Body mass index is 36.65 kg/(m^2). General: Well developed, well nourished, in no acute distress. Nontoxic appearing. Head: Normocephalic, atraumatic, sclera non-icteric.  Neck: Supple. Lungs: Breathing is unlabored. Heart: Normal rate. Skin:  Warm and moist. Dressing and packing in place. No induration, erythema, or tenderness to palpation. Neuro: Alert and  oriented X 3. Moves all extremities spontaneously. Normal gait.  Psych:  Responds to questions appropriately with a normal affect.       PROCEDURE: Dressing and packing removed. No purulence expressed Wound bed healthy Irrigated with 1% plain lidocaine 5 cc. Repacked with 1/4 plain packing.  Dressing applied  LAB: Culture: not done  A/P: 62 y.o. y/o male with shoulder cellulitis/abscess as above s/p I&D on 02/23/13.  Wound care per above Continue doxycycline Pain well controlled Daily dressing changes Recheck 48 hours  Signed, Rhoderick Moody, PA-C 02/27/2013 8:19 AM

## 2013-03-01 ENCOUNTER — Encounter: Payer: Self-pay | Admitting: Physician Assistant

## 2013-03-01 ENCOUNTER — Ambulatory Visit (INDEPENDENT_AMBULATORY_CARE_PROVIDER_SITE_OTHER): Payer: 59 | Admitting: Physician Assistant

## 2013-03-01 VITALS — BP 112/72 | HR 75 | Temp 98.4°F | Resp 16 | Ht 68.0 in | Wt 240.0 lb

## 2013-03-01 DIAGNOSIS — L723 Sebaceous cyst: Secondary | ICD-10-CM

## 2013-03-01 DIAGNOSIS — L089 Local infection of the skin and subcutaneous tissue, unspecified: Secondary | ICD-10-CM

## 2013-03-01 NOTE — Progress Notes (Signed)
Patient ID: Tyler Day MRN: [id number], DOB: 06-22-1950 62 y.o. Date of Encounter: 03/01/2013, 9:17 AM  Chief Complaint: Wound care   See previous note  HPI: 62 y.o. y/o male presents for wound care s/p I&D on 02/23/13.  Doing well No issues or complaints Afebrile/ no chills No nausea or vomiting Tolerating doxycycline.  Pain resolved.  Daily dressing change Previous note reviewed  Past Medical History  Diagnosis Date  . Myocardial infarction   . Hypertension   . Hyperlipidemia      Home Meds: Prior to Admission medications   Medication Sig Start Date End Date Taking? Authorizing Provider  aspirin 81 MG tablet Take 81 mg by mouth daily.   Yes Historical Provider, MD  doxycycline (VIBRA-TABS) 100 MG tablet Take 1 tablet (100 mg total) by mouth 2 (two) times daily. 02/23/13  Yes Elvina Sidle, MD  metoprolol (LOPRESSOR) 100 MG tablet Take 100 mg by mouth 2 (two) times daily.   Yes Historical Provider, MD  ramipril (ALTACE) 10 MG tablet Take 10 mg by mouth daily.   Yes Historical Provider, MD  simvastatin (ZOCOR) 40 MG tablet Take 40 mg by mouth every evening.   Yes Historical Provider, MD    Allergies: No Known Allergies  ROS: Constitutional: Afebrile, no chills Cardiovascular: negative for chest pain or palpitations Dermatological: Positive for wound. Negative for erythema, pain, or warmth  GI: No nausea or vomiting   EXAM: Physical Exam: Blood pressure 112/72, pulse 75, temperature 98.4 F (36.9 C), temperature source Oral, resp. rate 16, height 5\' 8"  (1.727 m), weight 240 lb (108.863 kg), SpO2 98.00%., Body mass index is 36.5 kg/(m^2). General: Well developed, well nourished, in no acute distress. Nontoxic appearing. Head: Normocephalic, atraumatic, sclera non-icteric.  Neck: Supple. Lungs: Breathing is unlabored. Heart: Normal rate. Skin:  Warm and moist. Dressing and packing in place. No induration, erythema, or tenderness to palpation. Neuro: Alert and  oriented X 3. Moves all extremities spontaneously. Normal gait.  Psych:  Responds to questions appropriately with a normal affect.       PROCEDURE: Dressing and packing removed. No purulence expressed Wound bed healthy Irrigated with 1% plain lidocaine 5 cc. Not repacked.  Dressing applied  LAB: Culture: not done  A/P: 62 y.o. y/o male with infected sebaceous cyst on back as above s/p I&D on 02/23/13.  Wound care per above Continue doxycycline.  Pain well controlled Daily dressing changes Recheck as needed.   Grier Mitts, PA-C 03/01/2013 9:17 AM

## 2014-05-05 ENCOUNTER — Other Ambulatory Visit (HOSPITAL_COMMUNITY): Payer: Self-pay | Admitting: Cardiology

## 2014-05-05 DIAGNOSIS — R079 Chest pain, unspecified: Secondary | ICD-10-CM

## 2014-05-12 ENCOUNTER — Encounter (HOSPITAL_COMMUNITY)
Admission: RE | Admit: 2014-05-12 | Discharge: 2014-05-12 | Disposition: A | Discharge status: Home / Self Care | Payer: 59 | Source: Ambulatory Visit | Attending: Cardiology | Admitting: Cardiology

## 2014-05-12 ENCOUNTER — Other Ambulatory Visit: Payer: Self-pay

## 2014-05-12 DIAGNOSIS — I259 Chronic ischemic heart disease, unspecified: Secondary | ICD-10-CM | POA: Diagnosis not present

## 2014-05-12 DIAGNOSIS — I4510 Unspecified right bundle-branch block: Secondary | ICD-10-CM | POA: Insufficient documentation

## 2014-05-12 DIAGNOSIS — R0789 Other chest pain: Secondary | ICD-10-CM | POA: Diagnosis present

## 2014-05-12 DIAGNOSIS — I4940 Unspecified premature depolarization: Secondary | ICD-10-CM | POA: Insufficient documentation

## 2014-05-12 DIAGNOSIS — R079 Chest pain, unspecified: Secondary | ICD-10-CM

## 2014-05-12 LAB — BASIC METABOLIC PANEL
ANION GAP: 7 (ref 5–15)
BUN: 12 mg/dL (ref 6–23)
CALCIUM: 9.2 mg/dL (ref 8.4–10.5)
CHLORIDE: 102 mmol/L (ref 96–112)
CO2: 30 mmol/L (ref 19–32)
Creatinine, Ser: 1.03 mg/dL (ref 0.50–1.35)
GFR, EST AFRICAN AMERICAN: 89 mL/min — AB (ref >90)
GFR, EST NON AFRICAN AMERICAN: 76 mL/min — AB (ref >90)
Glucose, Bld: 110 mg/dL — ABNORMAL HIGH (ref 70–99)
Potassium: 4.4 mmol/L (ref 3.5–5.1)
SODIUM: 139 mmol/L (ref 135–145)

## 2014-05-12 LAB — HEPATIC FUNCTION PANEL
ALBUMIN: 4 g/dL (ref 3.5–5.2)
ALT: 36 U/L (ref 0–53)
AST: 24 U/L (ref 0–37)
Alkaline Phosphatase: 66 U/L (ref 39–117)
Bilirubin, Direct: <0.1 mg/dL (ref 0.0–0.5)
TOTAL PROTEIN: 7.6 g/dL (ref 6.0–8.3)
Total Bilirubin: 0.7 mg/dL (ref 0.3–1.2)

## 2014-05-12 LAB — LIPID PANEL
CHOL/HDL RATIO: 3.9 ratio
CHOLESTEROL: 156 mg/dL (ref 0–200)
HDL: 40 mg/dL (ref >39)
LDL CALC: 88 mg/dL (ref 0–99)
Triglycerides: 140 mg/dL (ref <150)
VLDL: 28 mg/dL (ref 0–40)

## 2014-05-12 MED ORDER — REGADENOSON 0.4 MG/5ML IV SOLN
0.4000 mg | Freq: Once | INTRAVENOUS | Status: AC
  Administered 2014-05-12: 0.4 mg via INTRAVENOUS

## 2014-05-12 MED ORDER — REGADENOSON 0.4 MG/5ML IV SOLN
INTRAVENOUS | Status: AC
  Filled 2014-05-12: qty 5

## 2014-05-12 MED ORDER — TECHNETIUM TC 99M SESTAMIBI GENERIC - CARDIOLITE
10.0000 | Freq: Once | INTRAVENOUS | Status: AC | PRN
  Administered 2014-05-12: 10 via INTRAVENOUS

## 2014-05-12 MED ORDER — TECHNETIUM TC 99M SESTAMIBI GENERIC - CARDIOLITE
30.0000 | Freq: Once | INTRAVENOUS | Status: AC | PRN
  Administered 2014-05-12: 30 via INTRAVENOUS

## 2014-05-13 LAB — HEMOGLOBIN A1C
HEMOGLOBIN A1C: 6 % — AB (ref 4.8–5.6)
MEAN PLASMA GLUCOSE: 126 mg/dL

## 2014-05-19 ENCOUNTER — Encounter (HOSPITAL_COMMUNITY): Payer: Self-pay | Admitting: Pharmacy Technician

## 2014-05-25 ENCOUNTER — Encounter (HOSPITAL_COMMUNITY)
Admission: RE | Disposition: A | Discharge status: Home / Self Care | Payer: 59 | Source: Ambulatory Visit | Attending: Cardiology

## 2014-05-25 ENCOUNTER — Encounter (HOSPITAL_COMMUNITY): Payer: Self-pay | Admitting: General Practice

## 2014-05-25 ENCOUNTER — Ambulatory Visit (HOSPITAL_COMMUNITY)
Admission: RE | Admit: 2014-05-25 | Discharge: 2014-05-26 | Disposition: A | Discharge status: Home / Self Care | Payer: 59 | Source: Ambulatory Visit | Attending: Cardiology | Admitting: Cardiology

## 2014-05-25 DIAGNOSIS — I209 Angina pectoris, unspecified: Secondary | ICD-10-CM | POA: Diagnosis present

## 2014-05-25 DIAGNOSIS — E780 Pure hypercholesterolemia: Secondary | ICD-10-CM | POA: Insufficient documentation

## 2014-05-25 DIAGNOSIS — Z87891 Personal history of nicotine dependence: Secondary | ICD-10-CM | POA: Insufficient documentation

## 2014-05-25 DIAGNOSIS — E119 Type 2 diabetes mellitus without complications: Secondary | ICD-10-CM | POA: Insufficient documentation

## 2014-05-25 DIAGNOSIS — E785 Hyperlipidemia, unspecified: Secondary | ICD-10-CM | POA: Diagnosis not present

## 2014-05-25 DIAGNOSIS — I10 Essential (primary) hypertension: Secondary | ICD-10-CM | POA: Insufficient documentation

## 2014-05-25 DIAGNOSIS — I252 Old myocardial infarction: Secondary | ICD-10-CM | POA: Diagnosis not present

## 2014-05-25 DIAGNOSIS — I25119 Atherosclerotic heart disease of native coronary artery with unspecified angina pectoris: Secondary | ICD-10-CM | POA: Insufficient documentation

## 2014-05-25 DIAGNOSIS — Z955 Presence of coronary angioplasty implant and graft: Secondary | ICD-10-CM | POA: Diagnosis not present

## 2014-05-25 DIAGNOSIS — Z6833 Body mass index (BMI) 33.0-33.9, adult: Secondary | ICD-10-CM | POA: Diagnosis not present

## 2014-05-25 HISTORY — DX: Atherosclerotic heart disease of native coronary artery without angina pectoris: I25.10

## 2014-05-25 HISTORY — PX: FRACTIONAL FLOW RESERVE WIRE: SHX5839

## 2014-05-25 HISTORY — PX: LEFT HEART CATHETERIZATION WITH CORONARY ANGIOGRAM: SHX5451

## 2014-05-25 HISTORY — PX: CARDIAC CATHETERIZATION: SHX172

## 2014-05-25 LAB — POCT ACTIVATED CLOTTING TIME
Activated Clotting Time: 411 seconds
Activated Clotting Time: 165 seconds

## 2014-05-25 SURGERY — LEFT HEART CATHETERIZATION WITH CORONARY ANGIOGRAM
Anesthesia: LOCAL

## 2014-05-25 MED ORDER — ASPIRIN 81 MG PO TABS: 81.0000 mg | ORAL_TABLET | Freq: Every day | ORAL | Status: DC

## 2014-05-25 MED ORDER — SODIUM CHLORIDE 0.9 % IV SOLN: 250.0000 mL | INTRAVENOUS | Status: DC | PRN

## 2014-05-25 MED ORDER — RAMIPRIL 10 MG PO TABS: 10.0000 mg | ORAL_TABLET | Freq: Every day | ORAL | Status: DC

## 2014-05-25 MED ORDER — BIVALIRUDIN 250 MG IV SOLR
INTRAVENOUS | Status: AC
  Filled 2014-05-25: qty 250

## 2014-05-25 MED ORDER — SODIUM CHLORIDE 0.9 % IJ SOLN: 3.0000 mL | Freq: Two times a day (BID) | INTRAMUSCULAR | Status: DC

## 2014-05-25 MED ORDER — RAMIPRIL 10 MG PO CAPS
10.0000 mg | ORAL_CAPSULE | Freq: Every day | ORAL | Status: DC
  Administered 2014-05-26: 10 mg via ORAL
  Filled 2014-05-25: qty 1

## 2014-05-25 MED ORDER — CLOPIDOGREL BISULFATE 75 MG PO TABS
75.0000 mg | ORAL_TABLET | Freq: Every day | ORAL | Status: DC
  Administered 2014-05-25 – 2014-05-26 (×2): 75 mg via ORAL
  Filled 2014-05-25 (×2): qty 1

## 2014-05-25 MED ORDER — SIMVASTATIN 40 MG PO TABS
40.0000 mg | ORAL_TABLET | Freq: Every evening | ORAL | Status: DC
  Administered 2014-05-25: 18:00:00 40 mg via ORAL
  Filled 2014-05-25 (×2): qty 1

## 2014-05-25 MED ORDER — DIAZEPAM 5 MG PO TABS
5.0000 mg | ORAL_TABLET | Freq: Once | ORAL | Status: AC
  Administered 2014-05-25: 5 mg via ORAL

## 2014-05-25 MED ORDER — ASPIRIN 81 MG PO CHEW
CHEWABLE_TABLET | ORAL | Status: AC
  Filled 2014-05-25: qty 1

## 2014-05-25 MED ORDER — MIDAZOLAM HCL 2 MG/2ML IJ SOLN
INTRAMUSCULAR | Status: AC
  Filled 2014-05-25: qty 2

## 2014-05-25 MED ORDER — NITROGLYCERIN 1 MG/10 ML FOR IR/CATH LAB
INTRA_ARTERIAL | Status: AC
  Filled 2014-05-25: qty 10

## 2014-05-25 MED ORDER — METOPROLOL TARTRATE 25 MG PO TABS
50.0000 mg | ORAL_TABLET | Freq: Two times a day (BID) | ORAL | Status: DC
  Administered 2014-05-25 – 2014-05-26 (×2): 50 mg via ORAL
  Filled 2014-05-25 (×3): qty 2

## 2014-05-25 MED ORDER — SODIUM CHLORIDE 0.9 % IV SOLN: INTRAVENOUS | Status: AC

## 2014-05-25 MED ORDER — ACETAMINOPHEN 325 MG PO TABS: 650.0000 mg | ORAL_TABLET | ORAL | Status: DC | PRN

## 2014-05-25 MED ORDER — ASPIRIN EC 81 MG PO TBEC
81.0000 mg | DELAYED_RELEASE_TABLET | Freq: Every day | ORAL | Status: DC
  Administered 2014-05-26: 10:00:00 81 mg via ORAL
  Filled 2014-05-25: qty 1

## 2014-05-25 MED ORDER — HEPARIN (PORCINE) IN NACL 2-0.9 UNIT/ML-% IJ SOLN
INTRAMUSCULAR | Status: AC
  Filled 2014-05-25: qty 1000

## 2014-05-25 MED ORDER — DIAZEPAM 5 MG PO TABS
ORAL_TABLET | ORAL | Status: AC
  Filled 2014-05-25: qty 1

## 2014-05-25 MED ORDER — LIDOCAINE HCL (PF) 1 % IJ SOLN
INTRAMUSCULAR | Status: AC
  Filled 2014-05-25: qty 30

## 2014-05-25 MED ORDER — ADENOSINE 12 MG/4ML IV SOLN
16.0000 mL | Freq: Once | INTRAVENOUS | Status: DC
  Filled 2014-05-25: qty 16

## 2014-05-25 MED ORDER — SODIUM CHLORIDE 0.9 % IJ SOLN: 3.0000 mL | INTRAMUSCULAR | Status: DC | PRN

## 2014-05-25 MED ORDER — FENTANYL CITRATE 0.05 MG/ML IJ SOLN
INTRAMUSCULAR | Status: AC
  Filled 2014-05-25: qty 2

## 2014-05-25 MED ORDER — SODIUM CHLORIDE 0.9 % IV SOLN
INTRAVENOUS | Status: DC
  Administered 2014-05-25: 08:00:00 via INTRAVENOUS

## 2014-05-25 MED ORDER — ONDANSETRON HCL 4 MG/2ML IJ SOLN: 4.0000 mg | Freq: Four times a day (QID) | INTRAMUSCULAR | Status: DC | PRN

## 2014-05-25 MED ORDER — ASPIRIN 81 MG PO CHEW
81.0000 mg | CHEWABLE_TABLET | ORAL | Status: AC
  Administered 2014-05-25: 81 mg via ORAL

## 2014-05-25 NOTE — H&P (Signed)
Dictated H&P in the chart needs to be scanned 

## 2014-05-25 NOTE — Progress Notes (Signed)
Site area: right groin  Site Prior to Removal:  Level 0  Pressure Applied For 20 MINUTES    Minutes Beginning at 1525  Manual:   Yes.    Patient Status During Pull:  AAO x 4  Post Pull Groin Site:  Level 1  Post Pull Instructions Given:  Yes.    Post Pull Pulses Present:  Yes.    Dressing Applied:  Yes.    Comments:  Tolerated procedure well

## 2014-05-25 NOTE — Interval H&P Note (Signed)
Cath Lab Visit (complete for each Cath Lab visit)  Clinical Evaluation Leading to the Procedure:   ACS: No.  Non-ACS:    Anginal Classification: CCS III  Anti-ischemic medical therapy: Maximal Therapy (2 or more classes of medications)  Non-Invasive Test Results: Intermediate-risk stress test findings: cardiac mortality 1-3%/year  Prior CABG: No previous CABG      History and Physical Interval Note:  05/25/2014 11:06 AM  Tyler Day  has presented today for surgery, with the diagnosis of abnormal stress test  The various methods of treatment have been discussed with the patient and family. After consideration of risks, benefits and other options for treatment, the patient has consented to  Procedure(s): LEFT HEART CATHETERIZATION WITH CORONARY ANGIOGRAM (N/A) as a surgical intervention .  The patient's history has been reviewed, patient examined, no change in status, stable for surgery.  I have reviewed the patient's chart and labs.  Questions were answered to the patient's satisfaction.     Robynn PaneHARWANI,Josel Keo N

## 2014-05-25 NOTE — Progress Notes (Signed)
Subjective:  Doing well denies any chest pain or shortness of breath. Tolerated left cardiac cath/FFR 4 LAD which was 0.82.  Objective:  Vital Signs in the last 24 hours: Temp:  [97.4 F (36.3 C)-98.1 F (36.7 C)] 98.1 F (36.7 C) (02/16 1300) Pulse Rate:  [54-64] 64 (02/16 1345) Resp:  [8-20] 18 (02/16 1300) BP: (93-119)/(56-75) 118/67 mmHg (02/16 1345) SpO2:  [98 %-100 %] 100 % (02/16 1345) Weight:  [102.059 kg (225 lb)] 102.059 kg (225 lb) (02/16 0715)  Intake/Output from previous day:   Intake/Output from this shift:    Physical Exam: Neck: no adenopathy, no carotid bruit, no JVD and supple, symmetrical, trachea midline Lungs: clear to auscultation bilaterally Heart: regular rate and rhythm, S1, S2 normal, no murmur, click, rub or gallop Abdomen: soft, non-tender; bowel sounds normal; no masses,  no organomegaly Extremities: extremities normal, atraumatic, no cyanosis or edema and Right groin dressing dry  Lab Results: No results for input(s): WBC, HGB, PLT in the last 72 hours. No results for input(s): NA, K, CL, CO2, GLUCOSE, BUN, CREATININE in the last 72 hours. No results for input(s): TROPONINI in the last 72 hours.  Invalid input(s): CK, MB Hepatic Function Panel No results for input(s): PROT, ALBUMIN, AST, ALT, ALKPHOS, BILITOT, BILIDIR, IBILI in the last 72 hours. No results for input(s): CHOL in the last 72 hours. No results for input(s): PROTIME in the last 72 hours.  Imaging: Imaging results have been reviewed and No results found.  Cardiac Studies:  Assessment/Plan:  Stable angina positive nuclear stress test status post left cath/FFR Multivessel CAD History of anterolateral wall MI in the past status post PCI to LAD and left circumflex he did Type 2 diabetes mellitus Hypertension Hypercholesteremia Morbid obesity History of tobacco abuse in the past Plan Continue present management Possible discharge tomorrow if stable      Tyler Day  N 05/25/2014, 5:40 PM

## 2014-05-25 NOTE — CV Procedure (Signed)
Left cardiac cath/fractional flow reserve report dictated on 05/25/2014 dictation number is [id number]

## 2014-05-26 DIAGNOSIS — I25119 Atherosclerotic heart disease of native coronary artery with unspecified angina pectoris: Secondary | ICD-10-CM | POA: Diagnosis not present

## 2014-05-26 MED FILL — Sodium Chloride IV Soln 0.9%: INTRAVENOUS | Qty: 50 | Status: AC

## 2014-05-26 NOTE — Discharge Summary (Signed)
Discharge summary dictated on 05/26/2014 dictation number is [id number]

## 2014-05-26 NOTE — Cardiovascular Report (Signed)
NAMESIDDHANTH, Tyler Day                ACCOUNT NO.:  [id number]  MEDICAL RECORD NO.:  [id number]  LOCATION:                                 FACILITY:  PHYSICIAN:  Draco Malczewski N. Sharyn Lull, M.D. DATE OF BIRTH:  [birth year]  DATE OF PROCEDURE:  05/25/2014 DATE OF DISCHARGE:  05/25/2014                           CARDIAC CATHETERIZATION   PROCEDURE: 1. Left cardiac cath with selective left and right coronary     angiography, LV graphy via right groin using Judkins technique. 2. Measurement of fractional flow reserve in left anterior descending     artery.  INDICATION FOR PROCEDURE:  Tyler Day is 64 year old male with past medical history significant for coronary artery disease, history of anterolateral wall myocardial infarction in February 2010, status post PTCA stenting to 100% occluded LAD followed by the PCI to ostial left circumflex,  hypertension, hypercholesteremia, morbid obesity, positive family history of coronary artery disease, history of tobacco abuse.  He came to the office complaining of exertional chest tightness associated with shortness of breath, relieves with rest.  The patient denies any nausea, vomiting, or diaphoresis.  Denies PND, orthopnea, or leg swelling.  Denies palpitation, lightheadedness, or syncope.  The patient underwent Lexiscan Myoview, which showed anterior and inferior wall scarring with peri-infarct ischemia with EF of 38% due to typical anginal chest pain, positive nuclear stress test, and multiple risk factors, discussed with the patient regarding left cath, possible PTCA stenting, its risks and benefits, i.e., death, MI, stroke, need for emergency CABG, local vascular complications, etc., and consented for PCI.  DESCRIPTION OF PROCEDURE:  After obtaining the informed consent, the patient was brought to the cath lab and was placed on fluoroscopy table. Right groin was prepped and draped in usual fashion.  1% Xylocaine was used for local anesthesia in the  right groin.  With the help of thin wall needle, 5-French arterial sheath was placed.  The sheath was aspirated and flushed.  Next, 5-French left Judkins catheter was advanced over the wire under fluoroscopic guidance up to the ascending aorta.  Wire was pulled out.  The catheter was aspirated and connected to the Manifold.  Catheter was further advanced and engaged into left coronary ostium.  Multiple views of the left system were taken.  Next, catheter was disengaged and was pulled out over the wire and was replaced with 5-French right  Judkins catheter, which was advanced over the wire under fluoroscopic guidance up to the ascending aorta.  Wire was pulled out.  The catheter was aspirated and connected to the Manifold.  Catheter was further advanced and engaged into right coronary ostium.  A single view of right coronary artery was obtained.  Next, catheter was disengaged and was pulled out over the wire and was replaced with 5-French pigtail catheter, which was advanced over the wire under fluoroscopic guidance up to the ascending aorta.  Catheter was further  advanced across the aortic valve into the LV.  LV pressures were recorded.  Next, LV graft was done in 30-degree RAO position.  Post- angiographic pressures were recorded from LV and then pullback pressures were recorded from the aorta.  There was no gradient across the aortic valve.  Next, the pigtail catheter was pulled out.  [name] were aspirated and flushed.  Findings and LV showed mild  anterolateral wall hypokinesia, LVH EF of approximately 45%-50%, left main has 40%-50% ostial and distal stenosis as before.  LAD has 50%-60% mid in-stent and distal edge restenosis with haziness with TIMI grade 3 distal flow. Diagonal 1 has 20%-25% ostial stenosis.  Left circumflex is patent at prior PTCA stented site and is 100% occluded distally, which was filling by collaterals from the right system.  OM1 is long, which has  mild disease.  OM2 and OM3 were very very small.  OM4 has 10%-20% ostial stenosis.  RCA has 30%-40% ostial stenosis and 15%-20% proximal and mid stenosis.  PDA is small which is patent.  PLV branches were very very small and it was supplying also collaterals to distal left circumflex. Interventional procedure, FFR was done using 140 mcg/kg per minute of IV adenosine over 3 minutes in mid LAD to evaluate the significance of mid in-stent and distal edge restenosis, FFR was 0.82, which was not physiologically significant.  The patient tolerated the procedure well. There were no complications.  The patient was transferred to recovery room in stable condition.  PLAN:  To maximize antianginal medications and treat medically.     Tyler OsierMohan N. Sharyn Day, M.D.     MNH/MEDQ  D:  05/25/2014  T:  05/26/2014  Job:  [id number]

## 2014-05-26 NOTE — Discharge Instructions (Signed)
Coronary Angiogram °A coronary angiogram, also called coronary angiography, is an X-ray procedure used to look at the arteries in the heart. In this procedure, a dye (contrast dye) is injected through a long, hollow tube (catheter). The catheter is about the size of a piece of cooked spaghetti and is inserted through your groin, wrist, or arm. The dye is injected into each artery, and X-rays are then taken to show if there is a blockage in the arteries of your heart. °LET YOUR HEALTH CARE PROVIDER KNOW ABOUT: °· Any allergies you have, including allergies to shellfish or contrast dye.   °· All medicines you are taking, including vitamins, herbs, eye drops, creams, and over-the-counter medicines.   °· Previous problems you or members of your family have had with the use of anesthetics.   °· Any blood disorders you have.   °· Previous surgeries you have had. °· History of kidney problems or failure.   °· Other medical conditions you have. °RISKS AND COMPLICATIONS  °Generally, a coronary angiogram is a safe procedure. However, problems can occur and include: °· Allergic reaction to the dye. °· Bleeding from the access site or other locations. °· Kidney injury, especially in people with impaired kidney function.  °· Stroke (rare). °· Heart attack (rare). °BEFORE THE PROCEDURE  °· Do not eat or drink anything after midnight the night before the procedure or as directed by your health care provider.   °· Ask your health care provider about changing or stopping your regular medicines. This is especially important if you are taking diabetes medicines or blood thinners. °PROCEDURE °· You may be given a medicine to help you relax (sedative) before the procedure. This medicine is given through an intravenous (IV) access tube that is inserted into one of your veins.   °· The area where the catheter will be inserted will be washed and shaved. This is usually done in the groin but may be done in the fold of your arm (near your  elbow) or in the wrist.    °· A medicine will be given to numb the area where the catheter will be inserted (local anesthetic).   °· The health care provider will insert the catheter into an artery. The catheter will be guided by using a special type of X-ray (fluoroscopy) of the blood vessel being examined.   °· A special dye will then be injected into the catheter, and X-rays will be taken. The dye will help to show where any narrowing or blockages are located in the heart arteries.   °AFTER THE PROCEDURE  °· If the procedure is done through the leg, you will be kept in bed lying flat for several hours. You will be instructed to not bend or cross your legs. °· The insertion site will be checked frequently.   °· The pulse in your feet or wrist will be checked frequently.   °· Additional blood tests, X-rays, and an electrocardiogram may be done.   °Document Released: 09/30/2002 Document Revised: 08/10/2013 Document Reviewed: 08/18/2012 °ExitCare® Patient Information ©2015 [employer]. This information is not intended to replace advice given to you by your health care provider. Make sure you discuss any questions you have with your health care provider. ° °

## 2014-05-27 NOTE — Discharge Summary (Signed)
NAMGildardo Cranker:  Day, Tyler                ACCOUNT NO.:  [id number]  MEDICAL RECORD NO.:  [id number]  LOCATION:  6C03C                        FACILITY:  [facility]  PHYSICIAN:  Kiel Cockerell N. Sharyn LullHarwani, M.D. DATE OF BIRTH:  1950/10/22  DATE OF ADMISSION:  05/25/2014 DATE OF DISCHARGE:  05/26/2014                              DISCHARGE SUMMARY   ADMITTING DIAGNOSES: 1. New onset angina, positive Lexiscan Myoview, rule out progression     of coronary artery disease. 2. Multivessel coronary artery disease. 3. 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 4. Type 2 diabetes mellitus. 5. Hypertension. 6. Hyperlipidemia. 7. Obesity. 8. History of tobacco abuse.  DISCHARGE DIAGNOSES: 1. Stable angina status post left cardiac cath and fractional flow     reserve of left anterior descending, which was 0.82, which was not     physiologically significant for ischemia. 2. Multivessel coronary artery disease. 3. History of anterolateral wall myocardial infarction in the past     status post percutaneous coronary intervention to left anterior     descending and left circumflex in the past. 4. Type 2 diabetes mellitus, controlled by diet. 5. Hypertension. 6. Hypercholesteremia. 7. Morbid obesity. 8. Remote tobacco abuse.  DISCHARGE HOME MEDICATIONS: 1. Aspirin 81 mg 1 tablet daily. 2. Clopidogrel 75 mg 1 tablet daily. 3. Doxycycline 100 mg twice daily as before. 4. Metoprolol tartrate 50 mg twice daily. 5. Ramipril 10 mg daily. 6. Simvastatin 40 mg daily.  DIET:  Low salt, low cholesterol 1800 calories ADA diet.  DISCHARGE INSTRUCTIONS:  Post cardiac cath instructions have been given.  FOLLOWUP:  Follow up with me in 1 week.  CONDITION AT DISCHARGE:  Stable.  BRIEF HISTORY AND HOSPITAL COURSE:  Mr. Tyler Day is a [age] year old male with past medical history significant for coronary artery disease, history of anterolateral  wall myocardial infarction in February 2010 status post PTCA stenting to 100% occluded LAD followed by PCI to ostial left circumflex, hypertension, hypercholesteremia, morbid obesity, positive family history of coronary artery disease, history of tobacco abuse who came to the office complaining of exertional chest tightness associated with shortness of breath, relieved with rest.  The patient denies any nausea, vomiting, diaphoresis.  Denies PND, orthopnea, or leg swelling.  Denies palpitation, lightheadedness, or syncope.  The patient underwent nuclear stress test, which showed anterior and inferior wall scarring with peri-infarct ischemia with EF of 38%.  Due to typical anginal chest pain, positive nuclear stress test, multiple risk factors, discussed with the patient regarding cardiac cath, possible PTCA stenting, its risks and benefits, and consented for the PCI.  PHYSICAL EXAMINATION:  GENERAL:  He was alert, awake, oriented x3. VITAL SIGNS:  Blood pressure was 132/88, pulse was 73. HEENT:  Conjunctivae was pink. NECK:  Supple.  No JVD.  No bruit. LUNGS:  Clear to auscultation without rhonchi or rales. CARDIOVASCULAR:  S1, S2 was normal.  There was soft systolic murmur.  No S3 gallop. ABDOMEN:  Soft.  Bowel sounds present, nontender. EXTREMITIES:  There was no clubbing, cyanosis, or edema.  LABORATORY DATA:  Sodium is 139, potassium 4.4, BUN 12,  creatinine 1.03, cholesterol was 156, triglycerides 140, HDL 40, LDL 88, hemoglobin A1c was 6.0.  BRIEF HOSPITAL COURSE:  The patient was a.m. admit and underwent left cardiac cath with selective left and right coronary angiography and fractional flow reserve of LAD as per procedure report.  The patient tolerated procedure well.  There were no complications.  Postprocedure, the patient did not have any episodes of chest pain during the hospital stay.  His groin is stable with no evidence of hematoma or bruit.  The patient is ambulating  in room without any problems.  The patient will be discharged home on above medications and will be followed up in my office in 1 week.     Eduardo Osier. Sharyn Lull, M.D.     MNH/MEDQ  D:  05/26/2014  T:  05/27/2014  Job:  [id number]  cc:   Eduardo Osier. Sharyn Lull, M.D.

## 2016-06-27 ENCOUNTER — Ambulatory Visit (INDEPENDENT_AMBULATORY_CARE_PROVIDER_SITE_OTHER): Payer: BLUE CROSS/BLUE SHIELD | Admitting: Emergency Medicine

## 2016-06-27 ENCOUNTER — Ambulatory Visit (INDEPENDENT_AMBULATORY_CARE_PROVIDER_SITE_OTHER): Payer: BLUE CROSS/BLUE SHIELD

## 2016-06-27 VITALS — BP 124/80 | HR 98 | Temp 98.7°F | Resp 17 | Ht 68.0 in | Wt 257.0 lb

## 2016-06-27 DIAGNOSIS — L4050 Arthropathic psoriasis, unspecified: Secondary | ICD-10-CM | POA: Diagnosis not present

## 2016-06-27 DIAGNOSIS — M25562 Pain in left knee: Secondary | ICD-10-CM | POA: Diagnosis not present

## 2016-06-27 MED ORDER — HYDROCODONE-ACETAMINOPHEN 5-325 MG PO TABS: 1.0000 | ORAL_TABLET | Freq: Four times a day (QID) | ORAL | 0 refills | Status: AC | PRN

## 2016-06-27 NOTE — Patient Instructions (Addendum)
     IF you received an x-ray today, you will receive an invoice from [facility]. Please contact  Radiology at [phone] with questions or concerns regarding your invoice.   IF you received labwork today, you will receive an invoice from [facility]. Please contact [facility] at [phone] with questions or concerns regarding your invoice.   Our billing staff will not be able to assist you with questions regarding bills from these companies.  You will be contacted with the lab results as soon as they are available. The fastest way to get your results is to activate your My Chart account. Instructions are located on the last page of this paperwork. If you have not heard from us regarding the results in 2 weeks, please contact this office.      Knee Pain, Adult Many things can cause knee pain. The pain often goes away on its own with time and rest. If the pain does not go away, tests may be done to find out what is causing the pain. Follow these instructions at home: Activity  Rest your knee.  Do not do things that cause pain.  Avoid activities where both feet leave the ground at the same time (high-impact activities). Examples are running, jumping rope, and doing jumping jacks. General instructions  Take medicines only as told by your doctor.  Raise (elevate) your knee when you are resting. Make sure your knee is higher than your heart.  Sleep with a pillow under your knee.  If told, put ice on the knee: ? Put ice in a plastic bag. ? Place a towel between your skin and the bag. ? Leave the ice on for 20 minutes, 2-3 times a day.  Ask your doctor if you should wear an elastic knee support.  Lose weight if you are overweight. Being overweight can make your knee hurt more.  Do not use any tobacco products. These include cigarettes, chewing tobacco, or electronic cigarettes. If you need help quitting, ask your doctor. Smoking may slow down healing. Contact a  doctor if:  The pain does not stop.  The pain changes or gets worse.  You have a fever along with knee pain.  Your knee gives out or locks up.  Your knee swells, and becomes worse. Get help right away if:  Your knee feels warm.  You cannot move your knee.  You have very bad knee pain.  You have chest pain.  You have trouble breathing. Summary  Many things can cause knee pain. The pain often goes away on its own with time and rest.  Avoid activities that put stress on your knee. These include running and jumping rope.  Get help right away if you cannot move your knee, or if your knee feels warm, or if you have trouble breathing. This information is not intended to replace advice given to you by your health care provider. Make sure you discuss any questions you have with your health care provider. Document Released: 06/22/2008 Document Revised: 03/20/2016 Document Reviewed: 03/20/2016 Elsevier Interactive Patient Education  2017 Elsevier Inc.  

## 2016-06-27 NOTE — Progress Notes (Addendum)
Tyler Day 66 y.o.   Chief Complaint  Patient presents with  . Knee Pain    left     HISTORY OF PRESENT ILLNESS: This is a 66 y.o. male complaining of left knee pain x 2 weeks; denies injury but may have hurt it while squatting to pick up golf ball.   HPI   Prior to Admission medications   Medication Sig Start Date End Date Taking? Authorizing Provider  aspirin 81 MG tablet Take 81 mg by mouth daily.   Yes Historical Provider, MD  clopidogrel (PLAVIX) 75 MG tablet Take 75 mg by mouth daily.   Yes Historical Provider, MD  metoprolol (LOPRESSOR) 50 MG tablet Take 50 mg by mouth 2 (two) times daily.   Yes Historical Provider, MD  ramipril (ALTACE) 10 MG tablet Take 10 mg by mouth daily.   Yes Historical Provider, MD  simvastatin (ZOCOR) 40 MG tablet Take 40 mg by mouth every evening.   Yes Historical Provider, MD    No Known Allergies  Patient Active Problem List   Diagnosis Date Noted  . New-onset angina (HCC) 05/25/2014  . Coronary artery disease 01/08/2012  . Obesity 01/08/2012  . Hypertension 01/08/2012  . Hyperlipidemia LDL goal < 100 01/08/2012    Past Medical History:  Diagnosis Date  . Coronary artery disease   . Hyperlipidemia   . Hypertension   . Myocardial infarction     Past Surgical History:  Procedure Laterality Date  . CARDIAC CATHETERIZATION  05/25/2014  . FRACTIONAL FLOW RESERVE WIRE  05/25/2014   Procedure: FRACTIONAL FLOW RESERVE WIRE;  Surgeon: Robynn PaneMohan N Harwani, MD;  [facility];  Service: Cardiovascular;;  . LEFT HEART CATHETERIZATION WITH CORONARY ANGIOGRAM N/A 05/25/2014   Procedure: LEFT HEART CATHETERIZATION WITH CORONARY ANGIOGRAM;  Surgeon: Robynn PaneMohan N Harwani, MD;  [facility];  Service: Cardiovascular;  Laterality: N/A;  . SHOULDER SURGERY Right 1981    Social History   Social History  . Marital status: Divorced    Spouse name: N/A  . Number of children: N/A  . Years of education: N/A   Occupational History  .  Not on file.   Social History Main Topics  . Smoking status: Former Smoker    Packs/Day: 0.50    Years: 30.00    Types: Cigarettes    Quit date: 01/08/2008  . Smokeless tobacco: Never Used  . Alcohol use No  . Drug use: No  . Sexual activity: No   Other Topics Concern  . Not on file   Social History Narrative  . No narrative on file    No family history on file.   Review of Systems  Constitutional: Negative for chills and fever.  HENT: Negative.   Eyes: Negative.   Respiratory: Negative for cough and shortness of breath.   Cardiovascular: Negative for chest pain, claudication and leg swelling.  Gastrointestinal: Negative for abdominal pain, nausea and vomiting.  Genitourinary: Negative for dysuria and flank pain.  Musculoskeletal: Positive for joint pain (left knee).  Skin: Negative for rash.  Neurological: Negative for dizziness and headaches.  All other systems reviewed and are negative.  Vitals:   06/27/16 1653  BP: 124/80  Pulse: 98  Resp: 17  Temp: 98.7 F (37.1 C)     Physical Exam  Constitutional: He is oriented to person, place, and time. He appears well-developed and well-nourished.  HENT:  Head: Normocephalic and atraumatic.  Mouth/Throat: Oropharynx is clear and moist. No oropharyngeal exudate.  Eyes:  Conjunctivae and EOM are normal. Pupils are equal, round, and reactive to light.  Neck: Normal range of motion. Neck supple. No JVD present. No thyromegaly present.  Cardiovascular: Normal rate, regular rhythm and normal heart sounds.   Pulmonary/Chest: Effort normal and breath sounds normal.  Abdominal: Soft. There is no tenderness.  Musculoskeletal:  Left knee: no erythema and no visible swelling, FROM, stable in flexion and extension.  Lymphadenopathy:    He has no cervical adenopathy.  Neurological: He is alert and oriented to person, place, and time. He displays normal reflexes. No sensory deficit. He exhibits normal muscle tone.  Skin: Skin is  warm and dry. Capillary refill takes less than 2 seconds. No rash noted.  Vitals reviewed.  Knee Xray: no bony abnormality.  ASSESSMENT & PLAN: Tyler Day was seen today for knee pain.  Diagnoses and all orders for this visit:  Acute pain of left knee -     DG Knee 1-2 Views Left; Future -     Ambulatory referral to Orthopedic Surgery  Psoriatic arthritis (HCC)  Other orders -     HYDROcodone-acetaminophen (NORCO) 5-325 MG tablet; Take 1 tablet by mouth every 6 (six) hours as needed for moderate pain.     Patient Instructions       IF you received an x-ray today, you will receive an invoice from [facility]. Please contact [facility] at [phone] with questions or concerns regarding your invoice.   IF you received labwork today, you will receive an invoice from [facility]. Please contact [facility] at [phone] with questions or concerns regarding your invoice.   Our billing staff will not be able to assist you with questions regarding bills from these companies.  You will be contacted with the lab results as soon as they are available. The fastest way to get your results is to activate your My Chart account. Instructions are located on the last page of this paperwork. If you have not heard from [place] regarding the results in 2 weeks, please contact this office.      Knee Pain, Adult Many things can cause knee pain. The pain often goes away on its own with time and rest. If the pain does not go away, tests may be done to find out what is causing the pain. Follow these instructions at home: Activity   Rest your knee.  Do not do things that cause pain.  Avoid activities where both feet leave the ground at the same time (high-impact activities). Examples are running, jumping rope, and doing jumping jacks. General instructions   Take medicines only as told by your doctor.  Raise (elevate) your knee when you are resting. Make sure your knee is higher than  your heart.  Sleep with a pillow under your knee.  If told, put ice on the knee:  Put ice in a plastic bag.  Place a towel between your skin and the bag.  Leave the ice on for 20 minutes, 2-3 times a Day.  Ask your doctor if you should wear an elastic knee support.  Lose weight if you are overweight. Being overweight can make your knee hurt more.  Do not use any tobacco products. These include cigarettes, chewing tobacco, or electronic cigarettes. If you need help quitting, ask your doctor. Smoking may slow down healing. Contact a doctor if:  The pain does not stop.  The pain changes or gets worse.  You have a fever along with knee pain.  Your knee gives out or locks  up.  Your knee swells, and becomes worse. Get help right away if:  Your knee feels warm.  You cannot move your knee.  You have very bad knee pain.  You have chest pain.  You have trouble breathing. Summary  Many things can cause knee pain. The pain often goes away on its own with time and rest.  Avoid activities that put stress on your knee. These include running and jumping rope.  Get help right away if you cannot move your knee, or if your knee feels warm, or if you have trouble breathing. This information is not intended to replace advice given to you by your health care provider. Make sure you discuss any questions you have with your health care provider. Document Released: 06/22/2008 Document Revised: 03/20/2016 Document Reviewed: 03/20/2016 Elsevier Interactive Patient Education  2017 Elsevier Inc.     Edwina Barth, MD [facility]

## 2016-07-02 ENCOUNTER — Encounter (INDEPENDENT_AMBULATORY_CARE_PROVIDER_SITE_OTHER): Payer: Self-pay | Admitting: Orthopaedic Surgery

## 2016-07-02 ENCOUNTER — Ambulatory Visit (INDEPENDENT_AMBULATORY_CARE_PROVIDER_SITE_OTHER): Payer: BLUE CROSS/BLUE SHIELD | Admitting: Orthopaedic Surgery

## 2016-07-02 DIAGNOSIS — M25562 Pain in left knee: Secondary | ICD-10-CM | POA: Diagnosis not present

## 2016-07-02 MED ORDER — METHYLPREDNISOLONE ACETATE 40 MG/ML IJ SUSP
40.0000 mg | INTRAMUSCULAR | Status: AC | PRN
  Administered 2016-07-02: 40 mg via INTRA_ARTICULAR

## 2016-07-02 MED ORDER — LIDOCAINE HCL 1 % IJ SOLN
2.0000 mL | INTRAMUSCULAR | Status: AC | PRN
  Administered 2016-07-02: 2 mL

## 2016-07-02 MED ORDER — BUPIVACAINE HCL 0.5 % IJ SOLN
2.0000 mL | INTRAMUSCULAR | Status: AC | PRN
  Administered 2016-07-02: 2 mL via INTRA_ARTICULAR

## 2016-07-02 NOTE — Progress Notes (Addendum)
Office Visit Note   Patient: Tyler Day           Date of Birth: [birth year]           MRN: [id number] Visit Date: 07/02/2016              Requested by: Georgina Quint, MD [address] [place], [place] [address] PCP: No PCP Per Patient   Assessment & Plan: Visit Diagnoses:  1. Acute pain of left knee     Plan: The x-rays show no significant degenerative joint disease. I think he may have a degenerative meniscal tear. He does not have an effusion today. I'll like to start with a cortisone injection and then recheck in 3 weeks. If not better we'll consider MRI.Neoprene knee brace was given today  Follow-Up Instructions: Return in about 3 weeks (around 07/23/2016).   Orders:  No orders of the defined types were placed in this encounter.  No orders of the defined types were placed in this encounter.     Procedures: Large Joint Inj Date/Time: 07/02/2016 3:24 PM Performed by: Tarry Kos Authorized by: Tarry Kos   Consent Given by:  Patient Timeout: prior to procedure the correct patient, procedure, and site was verified   Indications:  Pain Location:  Knee Site:  R knee Prep: patient was prepped and draped in usual sterile fashion   Needle Size:  22 G Ultrasound Guidance: No   Fluoroscopic Guidance: No   Arthrogram: No   Medications:  2 mL lidocaine 1 %; 2 mL bupivacaine 0.5 %; 40 mg methylPREDNISolone acetate 40 MG/ML Patient tolerance:  Patient tolerated the procedure well with no immediate complications     Clinical Data: No additional findings.   Subjective: Chief Complaint  Patient presents with  . Left Knee - Pain    Patient is a 66 year old gentleman with left knee pain in the popliteal fossa for a couple weeks. Denies any injuries. He feels a sensation of giving way. He denies any popping or locking. He denies any swelling. His pain is worse with deep flexion. He denies any constitutional symptoms    Review of Systems  Constitutional:  Negative.   All other systems reviewed and are negative.    Objective: Vital Signs: There were no vitals taken for this visit.  Physical Exam  Constitutional: He is oriented to person, place, and time. He appears well-developed and well-nourished.  HENT:  Head: Normocephalic and atraumatic.  Eyes: Pupils are equal, round, and reactive to light.  Neck: Neck supple.  Pulmonary/Chest: Effort normal.  Abdominal: Soft.  Musculoskeletal: Normal range of motion.  Neurological: He is alert and oriented to person, place, and time.  Skin: Skin is warm.  Psychiatric: He has a normal mood and affect. His behavior is normal. Judgment and thought content normal.  Nursing note and vitals reviewed.   Ortho Exam Left knee exam shows no joint effusion. He has full extension and pain in the popliteal fossa with deep flexion. Collaterals and cruciates are stable. No significant medial joint or lateral joint line tenderness. Specialty Comments:  No specialty comments available.  Imaging: No results found.   PMFS History: Patient Active Problem List   Diagnosis Date Noted  . New-onset angina (HCC) 05/25/2014  . Coronary artery disease 01/08/2012  . Obesity 01/08/2012  . Hypertension 01/08/2012  . Hyperlipidemia LDL goal < 100 01/08/2012   Past Medical History:  Diagnosis Date  . Arthritis   . Coronary artery disease   .  History of psoriasis   . Hyperlipidemia   . Hypertension   . Myocardial infarction     No family history on file.  Past Surgical History:  Procedure Laterality Date  . CARDIAC CATHETERIZATION  05/25/2014  . FRACTIONAL FLOW RESERVE WIRE  05/25/2014   Procedure: FRACTIONAL FLOW RESERVE WIRE;  Surgeon: Robynn PaneMohan N Harwani, MD;  [facility];  Service: Cardiovascular;;  . LEFT HEART CATHETERIZATION WITH CORONARY ANGIOGRAM N/A 05/25/2014   Procedure: LEFT HEART CATHETERIZATION WITH CORONARY ANGIOGRAM;  Surgeon: Robynn PaneMohan N Harwani, MD;  [facility];  Service:  Cardiovascular;  Laterality: N/A;  . SHOULDER SURGERY Right 1981   Social History   Occupational History  . Not on file.   Social History Main Topics  . Smoking status: Former Smoker    Packs/day: 0.50    Years: 30.00    Types: Cigarettes    Quit date: 01/08/2008  . Smokeless tobacco: Never Used  . Alcohol use No  . Drug use: No  . Sexual activity: No

## 2016-07-11 ENCOUNTER — Other Ambulatory Visit (INDEPENDENT_AMBULATORY_CARE_PROVIDER_SITE_OTHER): Payer: Self-pay

## 2016-07-11 DIAGNOSIS — G8929 Other chronic pain: Secondary | ICD-10-CM

## 2016-07-11 DIAGNOSIS — M25562 Pain in left knee: Principal | ICD-10-CM

## 2016-07-20 ENCOUNTER — Ambulatory Visit (INDEPENDENT_AMBULATORY_CARE_PROVIDER_SITE_OTHER): Payer: BLUE CROSS/BLUE SHIELD | Admitting: Orthopaedic Surgery

## 2016-07-21 ENCOUNTER — Inpatient Hospital Stay: Admission: RE | Admit: 2016-07-21 | Payer: 59 | Source: Ambulatory Visit

## 2016-07-23 ENCOUNTER — Ambulatory Visit (INDEPENDENT_AMBULATORY_CARE_PROVIDER_SITE_OTHER): Payer: BLUE CROSS/BLUE SHIELD | Admitting: Orthopaedic Surgery

## 2022-11-07 ENCOUNTER — Ambulatory Visit (INDEPENDENT_AMBULATORY_CARE_PROVIDER_SITE_OTHER): Payer: Medicare HMO | Admitting: Ophthalmology

## 2022-11-07 ENCOUNTER — Encounter (INDEPENDENT_AMBULATORY_CARE_PROVIDER_SITE_OTHER): Payer: Self-pay | Admitting: Ophthalmology

## 2022-11-07 DIAGNOSIS — H35033 Hypertensive retinopathy, bilateral: Secondary | ICD-10-CM | POA: Diagnosis not present

## 2022-11-07 DIAGNOSIS — H25013 Cortical age-related cataract, bilateral: Secondary | ICD-10-CM

## 2022-11-07 DIAGNOSIS — I10 Essential (primary) hypertension: Secondary | ICD-10-CM

## 2022-11-07 DIAGNOSIS — H53121 Transient visual loss, right eye: Secondary | ICD-10-CM | POA: Diagnosis not present

## 2022-11-07 NOTE — Progress Notes (Signed)
[facility] - Clinic Note  11/07/2022   CHIEF COMPLAINT Patient presents for Retina Evaluation  HISTORY OF PRESENT ILLNESS: Tyler Day is a 72 y.o. male who presents to the clinic today for:  HPI     Retina Evaluation   In right eye.  This started 9 days ago.  Duration of 9 days.  Context:  distance vision, mid-range vision and near vision.  Treatments tried include no treatments.  I, the attending physician,  performed the HPI with the patient and updated documentation appropriately.        Comments   Patient referred by Dr. Conley Rolls for vision loss OD. Vision loss occurred on 07.20.24 OD. Vision was blurry for 3-4 hours and gradually returned during that time. Patient has a history of plavix use several years ago, due to stints in heart. Has not been on plavix for several years, but is on low dose aspirin once daily. Is on several blood pressure medications and blood pressure has been well controlled. However, on 07.18.24, BP was 190/140 at urgent med center. Checked a few minutes later and was 168/130. Has appointment with primary care doctor.  Patient also concerned about tick bite from [facility] tick in June. Was put on antibiotics and antibiotics caused strong reaction. Patient experiencing patches of hair missing on back of head, even after antibiotic treatment.       Last edited by Rennis Chris, MD on 11/07/2022 11:51 AM.    Patient states that 10 days ago he lost his vision for a period of time (about 1 hour) in the right eye. The vision did not go dark but instead saw light. The overall vision in the right eye was blurry for a few hours.   Referring physician: Conley Rolls [facility] [place], [place] [address] [place],  [place] [address]  HISTORICAL INFORMATION:  Selected notes from the MEDICAL RECORD NUMBER Referred by Dr. Conley Rolls for transient vision loss OD LEE:  Ocular Hx- PMH-   CURRENT MEDICATIONS: No current outpatient medications on file. (Ophthalmic Drugs)   No  current facility-administered medications for this visit. (Ophthalmic Drugs)   Current Outpatient Medications (Other)  Medication Sig   amLODipine (NORVASC) 5 MG tablet Take 5 mg by mouth daily.   aspirin 81 MG tablet Take 81 mg by mouth daily.   hydrochlorothiazide (HYDRODIURIL) 25 MG tablet Take 25 mg by mouth daily.   metoprolol (LOPRESSOR) 50 MG tablet Take 50 mg by mouth 2 (two) times daily.   ramipril (ALTACE) 10 MG tablet Take 10 mg by mouth daily.   simvastatin (ZOCOR) 40 MG tablet Take 40 mg by mouth every evening.   clopidogrel (PLAVIX) 75 MG tablet Take 75 mg by mouth daily. (Patient not taking: Reported on 11/07/2022)   HYDROcodone-acetaminophen (NORCO) 5-325 MG tablet Take 1 tablet by mouth every 6 (six) hours as needed for moderate pain. (Patient not taking: Reported on 07/02/2016)   No current facility-administered medications for this visit. (Other)   REVIEW OF SYSTEMS: ROS   Positive for: Eyes Negative for: Constitutional, Gastrointestinal, Neurological, Skin, Genitourinary, Musculoskeletal, HENT, Endocrine, Cardiovascular, Respiratory, Psychiatric, Allergic/Imm, Heme/Lymph Last edited by Annalee Genta D, COT on 11/07/2022  7:53 AM.     ALLERGIES No Known Allergies PAST MEDICAL HISTORY Past Medical History:  Diagnosis Date   Arthritis    Coronary artery disease    History of psoriasis    Hyperlipidemia    Hypertension    Myocardial infarction [facility])    Past  Surgical History:  Procedure Laterality Date   CARDIAC CATHETERIZATION  05/25/2014   FRACTIONAL FLOW RESERVE WIRE  05/25/2014   Procedure: FRACTIONAL FLOW RESERVE WIRE;  Surgeon: Robynn Pane, MD;  Location: [facility] CATH LAB;  Service: Cardiovascular;;   LEFT HEART CATHETERIZATION WITH CORONARY ANGIOGRAM N/A 05/25/2014   Procedure: LEFT HEART CATHETERIZATION WITH CORONARY ANGIOGRAM;  Surgeon: Robynn Pane, MD;  Location: [facility] CATH LAB;  Service: Cardiovascular;  Laterality: N/A;   SHOULDER SURGERY Right 1981    FAMILY HISTORY Family History  Problem Relation Age of Onset   Glaucoma Maternal Aunt    SOCIAL HISTORY Social History   Tobacco Use   Smoking status: Former    Current packs/day: 0.00    Average packs/day: 0.5 packs/day for 30.0 years (15.0 ttl pk-yrs)    Types: Cigarettes    Start date: 01/07/1978    Quit date: 01/08/2008    Years since quitting: 14.8   Smokeless tobacco: Never  Substance Use Topics   Alcohol use: No   Drug use: No       OPHTHALMIC EXAM:  Base Eye Exam     Visual Acuity (Snellen - Linear)       Right Left   Dist cc 20/20 20/40 +1   Dist ph cc  20/25 -2         Tonometry (Tonopen, 8:19 AM)       Right Left   Pressure 16 13         Pupils       Dark Light Shape React APD   Right 3 2 Round Slow None   Left 4 4 Round Minimal None         Visual Fields (Counting fingers)       Left Right    Full Full         Extraocular Movement       Right Left    Full, Ortho Full, Ortho         Neuro/Psych     Oriented x3: Yes   Mood/Affect: Normal           Slit Lamp and Fundus Exam     External Exam       Right Left   External Normal Normal         Slit Lamp Exam       Right Left   Lids/Lashes Dermatochalasis - upper lid, Meibomian gland dysfunction, Telangiectasia Dermatochalasis - upper lid   Conjunctiva/Sclera White and quiet White and quiet   Cornea Arcus, Debris in tear film Arcus, Debris in tear film, 1+ Punctate epithelial erosions   Anterior Chamber Deep and clear Deep and clear   Iris Round and dilated Round and dilated   Lens 2+ Nuclear sclerosis, 2-3+ Cortical cataract 2+ Nuclear sclerosis, 2-3+ Cortical cataract, positive vacuoles   Anterior Vitreous Vitreous syneresis Vitreous syneresis         Fundus Exam       Right Left   Disc Trace pallor, sharp rim Pink and sharp   C/D Ratio 0.2 0.2   Macula Flat, Good foveal reflex, RPE mottling, no heme/edema Flat, Good foveal reflex, RPE mottling, no  heme/edema   Vessels Vascular attenuation, Tortuous Vascular attenuation, Tortuous   Periphery Attached, No heme Attached, no heme, mild paving stone inferiorly           Refraction     Wearing Rx       Sphere Cylinder Axis Add   Right +1.00 +0.50  180 +2.25   Left +0.25 +0.75 173 +2.25         Manifest Refraction       Sphere Cylinder Axis Dist VA   Right +1.00 +0.50 180 20/20   Left +0.25 +1.00 165 20/25-2           IMAGING AND PROCEDURES  Imaging and Procedures for 11/07/2022  OCT, Retina - OU - Both Eyes       Right Eye Quality was good. Central Foveal Thickness: 244. Progression has no prior data. Findings include normal foveal contour, no IRF, no SRF, vitreomacular adhesion .   Left Eye Quality was good. Central Foveal Thickness: 248. Progression has no prior data. Findings include normal foveal contour, no IRF, no SRF, vitreomacular adhesion .   Notes *Images captured and stored on drive  Diagnosis / Impression:  NFP; no IRF/SRF OU  Clinical management:  See below  Abbreviations: NFP - Normal foveal profile. CME - cystoid macular edema. PED - pigment epithelial detachment. IRF - intraretinal fluid. SRF - subretinal fluid. EZ - ellipsoid zone. ERM - epiretinal membrane. ORA - outer retinal atrophy. ORT - outer retinal tubulation. SRHM - subretinal hyper-reflective material. IRHM - intraretinal hyper-reflective material      Fluorescein Angiography Optos (Transit OD)       Right Eye Progression has no prior data. Early phase findings include normal observations, delayed filling. Mid/Late phase findings include normal observations.   Left Eye Progression has no prior data. Early phase findings include normal observations. Mid/Late phase findings include normal observations.   Notes *Images captured and stored on drive  Diagnosis / Impression:  OD: mildly delayed filling time No leakage, blockage OU  Clinical management:  See  below  Abbreviations: NFP - Normal foveal profile. CME - cystoid macular edema. PED - pigment epithelial detachment. IRF - intraretinal fluid. SRF - subretinal fluid. EZ - ellipsoid zone. ERM - epiretinal membrane. ORA - outer retinal atrophy. ORT - outer retinal tubulation. SRHM - subretinal hyper-reflective material. IRHM - intraretinal hyper-reflective material           ASSESSMENT/PLAN:   ICD-10-CM   1. Transient visual loss, right eye  H53.121     2. Hypertensive retinopathy of both eyes  H35.033 OCT, Retina - OU - Both Eyes    Fluorescein Angiography Optos (Transit OD)    3. Essential hypertension  I10     4. Cortical age-related cataract of both eyes  H25.013      1. Transient vision loss OD  - isolated episode of decreased vision on Friday, 07.26.24, at 130 pm while watching tv  - pt reports "white light" obscured vision OD for ~1hr before it started to return to normal  - symptoms fully resolved by 730 pm that evening making episode ~6 hrs  - no repeat episodes and no associated symptoms at the time of event  - pt with history of CAD and MI in 2010  - BCVA OD 20/20 and currently asymptomatic  - dilated exam without findings / pathologies to explain symptoms -- no plaques or hemorrhage  - FA shows ?mildly delayed filling time OD but no leakage or blockage  - ?possible transient ischemic event  - discussed findings, prognosis  - no retinal or ophthalmic interventions indicated or recommended   - pt can f/u here as needed - pt is cleared from a retina standpoint for release to Dr. Conley Rolls and resumption of primary eye care   2,3. Hypertensive retinopathy OU  - OCT  shows WNL OU  - FA shows WNL OU - discussed importance of tight BP control - monitor   - PRN  3. Mixed Cataract OU - The symptoms of cataract, surgical options, and treatments and risks were discussed with patient. - discussed diagnosis and progression - monitor   Ophthalmic Meds Ordered this visit:  No  orders of the defined types were placed in this encounter.    Return if symptoms worsen or fail to improve.  There are no Patient Instructions on file for this visit.  Explained the diagnoses, plan, and follow up with the patient and they expressed understanding.  Patient expressed understanding of the importance of proper follow up care.   This document serves as a record of services personally performed by Karie Chimera, MD, PhD. It was created on their behalf by Gerilyn Nestle, COT an ophthalmic technician. The creation of this record is the provider's dictation and/or activities during the visit.    Electronically signed by:  Charlette Caffey, COT  11/07/22 11:56 AM  Karie Chimera, M.D., Ph.D. Diseases & Surgery of the Retina and Vitreous [facility] 11/07/2022  I have reviewed the above documentation for accuracy and completeness, and I agree with the above. Karie Chimera, M.D., Ph.D. 11/07/22 12:02 PM   Abbreviations: M myopia (nearsighted); A astigmatism; H hyperopia (farsighted); P presbyopia; Mrx spectacle prescription;  CTL contact lenses; OD right eye; OS left eye; OU both eyes  XT exotropia; ET esotropia; PEK punctate epithelial keratitis; PEE punctate epithelial erosions; DES dry eye syndrome; MGD meibomian gland dysfunction; ATs artificial tears; PFAT's preservative free artificial tears; NSC nuclear sclerotic cataract; PSC posterior subcapsular cataract; ERM epi-retinal membrane; PVD posterior vitreous detachment; RD retinal detachment; DM diabetes mellitus; DR diabetic retinopathy; NPDR non-proliferative diabetic retinopathy; PDR proliferative diabetic retinopathy; CSME clinically significant macular edema; DME diabetic macular edema; dbh dot blot hemorrhages; CWS cotton wool spot; POAG primary open angle glaucoma; C/D cup-to-disc ratio; HVF humphrey visual field; GVF goldmann visual field; OCT optical coherence tomography; IOP intraocular  pressure; BRVO Branch retinal vein occlusion; CRVO central retinal vein occlusion; CRAO central retinal artery occlusion; BRAO branch retinal artery occlusion; RT retinal tear; SB scleral buckle; PPV pars plana vitrectomy; VH Vitreous hemorrhage; PRP panretinal laser photocoagulation; IVK intravitreal kenalog; VMT vitreomacular traction; MH Macular hole;  NVD neovascularization of the disc; NVE neovascularization elsewhere; AREDS age related eye disease study; ARMD age related macular degeneration; POAG primary open angle glaucoma; EBMD epithelial/anterior basement membrane dystrophy; ACIOL anterior chamber intraocular lens; IOL intraocular lens; PCIOL posterior chamber intraocular lens; Phaco/IOL phacoemulsification with intraocular lens placement; PRK photorefractive keratectomy; LASIK laser assisted in situ keratomileusis; HTN hypertension; DM diabetes mellitus; COPD chronic obstructive pulmonary disease
